# Patient Record
Sex: Female | Born: 1985 | Race: White | Hispanic: No | Marital: Married | Smoking: Never smoker
Health system: Southern US, Community
[De-identification: ages and names within clinical notes are randomized; demographics above are authoritative.]

## PROBLEM LIST (undated history)

## (undated) DIAGNOSIS — G500 Trigeminal neuralgia: Secondary | ICD-10-CM

## (undated) DIAGNOSIS — N200 Calculus of kidney: Secondary | ICD-10-CM

## (undated) DIAGNOSIS — G43909 Migraine, unspecified, not intractable, without status migrainosus: Secondary | ICD-10-CM

## (undated) DIAGNOSIS — D649 Anemia, unspecified: Secondary | ICD-10-CM

## (undated) DIAGNOSIS — F329 Major depressive disorder, single episode, unspecified: Secondary | ICD-10-CM

## (undated) DIAGNOSIS — F32A Depression, unspecified: Secondary | ICD-10-CM

## (undated) DIAGNOSIS — N12 Tubulo-interstitial nephritis, not specified as acute or chronic: Secondary | ICD-10-CM

## (undated) DIAGNOSIS — Z8619 Personal history of other infectious and parasitic diseases: Secondary | ICD-10-CM

## (undated) HISTORY — DX: Major depressive disorder, single episode, unspecified: F32.9

## (undated) HISTORY — DX: Calculus of kidney: N20.0

## (undated) HISTORY — PX: WISDOM TOOTH EXTRACTION: SHX21

## (undated) HISTORY — DX: Depression, unspecified: F32.A

## (undated) HISTORY — DX: Trigeminal neuralgia: G50.0

## (undated) HISTORY — DX: Personal history of other infectious and parasitic diseases: Z86.19

## (undated) HISTORY — DX: Tubulo-interstitial nephritis, not specified as acute or chronic: N12

## (undated) HISTORY — DX: Migraine, unspecified, not intractable, without status migrainosus: G43.909

---

## 2008-01-02 DIAGNOSIS — N12 Tubulo-interstitial nephritis, not specified as acute or chronic: Secondary | ICD-10-CM

## 2008-01-02 HISTORY — DX: Tubulo-interstitial nephritis, not specified as acute or chronic: N12

## 2011-12-27 ENCOUNTER — Encounter (HOSPITAL_COMMUNITY): Payer: Self-pay | Admitting: Emergency Medicine

## 2011-12-27 ENCOUNTER — Emergency Department (HOSPITAL_COMMUNITY)
Admission: EM | Admit: 2011-12-27 | Discharge: 2011-12-27 | Disposition: A | Discharge status: Home / Self Care | Payer: 59 | Attending: Emergency Medicine | Admitting: Emergency Medicine

## 2011-12-27 DIAGNOSIS — G500 Trigeminal neuralgia: Secondary | ICD-10-CM | POA: Insufficient documentation

## 2011-12-27 DIAGNOSIS — K117 Disturbances of salivary secretion: Secondary | ICD-10-CM | POA: Insufficient documentation

## 2011-12-27 DIAGNOSIS — R209 Unspecified disturbances of skin sensation: Secondary | ICD-10-CM | POA: Insufficient documentation

## 2011-12-27 DIAGNOSIS — R1110 Vomiting, unspecified: Secondary | ICD-10-CM | POA: Insufficient documentation

## 2011-12-27 HISTORY — DX: Anemia, unspecified: D64.9

## 2011-12-27 MED ORDER — OXYCODONE-ACETAMINOPHEN 5-325 MG PO TABS
1.0000 | ORAL_TABLET | Freq: Once | ORAL | Status: AC
  Administered 2011-12-27: 1 via ORAL
  Filled 2011-12-27: qty 1

## 2011-12-27 MED ORDER — CARBAMAZEPINE 200 MG PO TABS: 100.0000 mg | ORAL_TABLET | Freq: Two times a day (BID) | ORAL | Status: DC

## 2011-12-27 MED ORDER — OXYCODONE-ACETAMINOPHEN 5-325 MG PO TABS: 1.0000 | ORAL_TABLET | ORAL | Status: DC | PRN

## 2011-12-27 NOTE — ED Notes (Signed)
Patient states that she was dx. With trigeminal neuralgia by the dentist and endodontist. Patient to follow up with neurologist. Today though she started to have increased pain, and drool. The patient also reports that she has numbness to her right side of her right side of the mouth.

## 2011-12-27 NOTE — ED Notes (Signed)
Pt states "she has been having numbness and tingling to R side of face since Sat. Went to PCP who referred her to Neurologist. Loetta Rough get an appointment until 1/15. PCP told her to come into ER for eval. States her bottom lip is numb and she has R side facial pain that radiates to her jaw. Pain comes and goes." Upon assessment, R side of face is symmetrical, c/o numbness, no tongue deviation, able to puff cheeks, eyes are symmetrical when closed. Denies any arm or chest pain. Good grips and strength BUE.

## 2011-12-27 NOTE — ED Provider Notes (Signed)
History     CSN: [id number]  Arrival date & time 12/27/11  1321   First MD Initiated Contact with Patient 12/27/11 1351      Chief Complaint  Patient presents with  . Facial Pain    (Consider location/radiation/quality/duration/timing/severity/associated sxs/prior treatment) The history is provided by the patient and the spouse.   Cc: 26 year old female here complaining of right facial pain numbness. Past medical history of anemia. Patient states that Friday she began to have pain in her right chin. Sunday she had pain and numbness in her chin. Monday she had pain in her lower lip that radiated to her right jaw, cheek, teeth, eye, head and the back of her neck. States that the pain is constant in her right chin 5/10 in she has episodes of pain that radiates 10 out of 10 to her jaw and head. States she was at [facility] to this morning and noticed she was drooling out of the right side of her mouth. Patient went to the dentist and had a normal x-ray. She then went to the orthodontist to refer her to a neurologist for trigeminal neuralgia.     Past Medical History  Diagnosis Date  . Anemia     History reviewed. No pertinent past surgical history.  History reviewed. No pertinent family history.  History  Substance Use Topics  . Smoking status: Never Smoker   . Smokeless tobacco: Not on file  . Alcohol Use: Yes     Comment: occ.    OB History    Grav Para Term Preterm Abortions TAB SAB Ect Mult Living                  Review of Systems  Constitutional: Negative.  Negative for fever and chills.  HENT: Positive for drooling. Negative for ear pain, congestion, facial swelling, trouble swallowing, dental problem and tinnitus.        Drooling R mouth  Eyes: Positive for pain.  Respiratory: Negative.  Negative for shortness of breath.   Cardiovascular: Negative.   Gastrointestinal: Positive for vomiting. Negative for nausea and abdominal pain.  Musculoskeletal: Negative.     Skin: Negative.   Neurological: Positive for numbness. Negative for dizziness, light-headedness and headaches.  Psychiatric/Behavioral: Negative.   All other systems reviewed and are negative.    Allergies  Review of patient's allergies indicates no known allergies.  Home Medications   Current Outpatient Rx  Name  Route  Sig  Dispense  Refill  . DROSPIRENONE-ETHINYL ESTRADIOL 3-0.03 MG PO TABS   Oral   Take 1 tablet by mouth daily.         Marland Kitchen FERROUS SULFATE 325 (65 FE) MG PO TABS   Oral   Take 325 mg by mouth daily with breakfast.         . IBUPROFEN 600 MG PO TABS   Oral   Take 600 mg by mouth every 6 (six) hours as needed. Pain           BP 110/71  Pulse 91  Temp 97.8 F (36.6 C) (Oral)  Resp 16  SpO2 98%  LMP 12/02/2011  Physical Exam  Nursing note and vitals reviewed. Constitutional: She is oriented to person, place, and time. She appears well-developed and well-nourished.  HENT:  Head: Normocephalic and atraumatic.  Eyes: Conjunctivae normal and EOM are normal. Pupils are equal, round, and reactive to light.  Neck: Normal range of motion. Neck supple.  Cardiovascular: Normal rate.   Pulmonary/Chest: Effort normal and  breath sounds normal. No respiratory distress.  Abdominal: Soft. Bowel sounds are normal. She exhibits no distension. There is no tenderness.  Musculoskeletal: Normal range of motion. She exhibits no edema and no tenderness.  Neurological: She is alert and oriented to person, place, and time. She has normal reflexes. No cranial nerve deficit. Coordination normal.  Skin: Skin is warm and dry.  Psychiatric: She has a normal mood and affect.    ED Course  Procedures (including critical care time)  Labs Reviewed - No data to display No results found.   No diagnosis found.    MDM  R facial trigeminal neuralgia.  Treated in ER with percocet with some relief.  Will start on tegretol and keep her follow up appointment with [facility]  Neurology.  Atyplical for stroke symptoms.  No weakness noted on R face or R side of body.  Pain and numbness.          Remi Haggard, NP 12/27/11 [phone]

## 2011-12-29 NOTE — ED Provider Notes (Signed)
Medical screening examination/treatment/procedure(s) were performed by non-physician practitioner and as supervising physician I was immediately available for consultation/collaboration.  Carin Shipp, MD 12/29/11 0739 

## 2012-04-23 ENCOUNTER — Emergency Department (HOSPITAL_COMMUNITY)
Admission: EM | Admit: 2012-04-23 | Discharge: 2012-04-23 | Disposition: A | Discharge status: Home / Self Care | Payer: BC Managed Care – PPO | Source: Home / Self Care | Attending: Family Medicine | Admitting: Family Medicine

## 2012-04-23 ENCOUNTER — Encounter (HOSPITAL_COMMUNITY): Payer: Self-pay

## 2012-04-23 ENCOUNTER — Emergency Department (INDEPENDENT_AMBULATORY_CARE_PROVIDER_SITE_OTHER): Payer: BC Managed Care – PPO

## 2012-04-23 DIAGNOSIS — S93609A Unspecified sprain of unspecified foot, initial encounter: Secondary | ICD-10-CM

## 2012-04-23 DIAGNOSIS — S93602A Unspecified sprain of left foot, initial encounter: Secondary | ICD-10-CM

## 2012-04-23 MED ORDER — HYDROCODONE-ACETAMINOPHEN 5-325 MG PO TABS
1.0000 | ORAL_TABLET | Freq: Once | ORAL | Status: AC
  Administered 2012-04-23: 1 via ORAL

## 2012-04-23 MED ORDER — IBUPROFEN 600 MG PO TABS: 600.0000 mg | ORAL_TABLET | Freq: Three times a day (TID) | ORAL | Status: DC | PRN

## 2012-04-23 MED ORDER — HYDROCODONE-ACETAMINOPHEN 5-325 MG PO TABS
ORAL_TABLET | ORAL | Status: AC
  Filled 2012-04-23: qty 1

## 2012-04-23 MED ORDER — HYDROCODONE-ACETAMINOPHEN 5-325 MG PO TABS: 2.0000 | ORAL_TABLET | Freq: Three times a day (TID) | ORAL | Status: DC | PRN

## 2012-04-23 NOTE — ED Notes (Signed)
States her foot was numb when she got up to walk , and felt her foot and toes snap; left foot slightly swollen

## 2012-04-23 NOTE — ED Provider Notes (Signed)
History     CSN: [id number]  Arrival date & time 04/23/12  1243   First MD Initiated Contact with Patient 04/23/12 1300      Chief Complaint  Patient presents with  . Foot Injury    (Consider location/radiation/quality/duration/timing/severity/associated sxs/prior treatment) HPI Comments: 27 year old female here complaining of left foot pain after she injured it earlier today. Patient stated she abruptly got up from the couch and accidentally walked over her inverted left foot and fell. Has experienced swelling and pain with walking since. She's able to put weight on his left leg and foot but experience discomfort with walking, also foot movement makes the pain worse.   Past Medical History  Diagnosis Date  . Anemia     History reviewed. No pertinent past surgical history.  History reviewed. No pertinent family history.  History  Substance Use Topics  . Smoking status: Never Smoker   . Smokeless tobacco: Not on file  . Alcohol Use: Yes     Comment: occ.    OB History   Grav Para Term Preterm Abortions TAB SAB Ect Mult Living                  Review of Systems  HENT:       No head injury  Respiratory:       No chest injury  Gastrointestinal:       No abdominal injury  Musculoskeletal:       As per history of present illness  Skin: Negative for wound.  Neurological: Negative for dizziness.  All other systems reviewed and are negative.    Allergies  Review of patient's allergies indicates no known allergies.  Home Medications   Current Outpatient Rx  Name  Route  Sig  Dispense  Refill  . carbamazepine (TEGRETOL) 200 MG tablet   Oral   Take 0.5 tablets (100 mg total) by mouth 2 (two) times daily.   60 tablet   0     Start 100mg  bid then increase daily by 200mg  until ...   . drospirenone-ethinyl estradiol (YASMIN,ZARAH,SYEDA) 3-0.03 MG tablet   Oral   Take 1 tablet by mouth daily.         . ferrous sulfate 325 (65 FE) MG tablet   Oral   Take  325 mg by mouth daily with breakfast.         . HYDROcodone-acetaminophen (NORCO/VICODIN) 5-325 MG per tablet   Oral   Take 2 tablets by mouth every 8 (eight) hours as needed for pain.   15 tablet   0   . ibuprofen (ADVIL,MOTRIN) 600 MG tablet   Oral   Take 1 tablet (600 mg total) by mouth every 8 (eight) hours as needed for pain.   30 tablet   0     BP 106/77  Pulse 99  Temp(Src) 98.5 F (36.9 C) (Oral)  Resp 20  SpO2 100%  LMP 04/11/2012  Physical Exam  Nursing note and vitals reviewed. Constitutional: She is oriented to person, place, and time. She appears well-developed and well-nourished. No distress.  Cardiovascular: Normal heart sounds.   Pulmonary/Chest: Breath sounds normal.  Musculoskeletal:  Left ankle exam normal. Left foot: No deformity. There is lateral swelling, mild bruising and tenderness to palpation lateral to mid foot. Limited dorsiflexion and plantaeextension due to pain, no laxity; weight bearing but reported pain with walking. No hematomas, laceration or abrasions. No toes discoloration or cyanosis. Tibial bone with no focal tenderness or swelling. Entire left lower  extremity appears neurovascularly intact.   Neurological: She is alert and oriented to person, place, and time.  Skin: She is not diaphoretic.  No skin abrasions or lacerations    ED Course  Procedures (including critical care time)  Labs Reviewed - No data to display Dg Foot Complete Left  04/23/2012  *RADIOLOGY REPORT*  Clinical Data: Per pt: walking on concrete and left foot was a sleep, kept walking and heard pop. Pain pointed to lateral aspect of  left foot. No prior injury to left foot  LEFT FOOT - COMPLETE 3+ VIEW  Comparison: None.  Findings: Lisfranc joint alignment normal.  Subtle transversely oriented lucency along the proximal tip of the base of the fifth metatarsal may represent nondisplaced avulsion fracture - correlate with point tenderness in this vicinity.  No  additional significant abnormality observed.  IMPRESSION:  1.  Subtle linear lucency at the proximal tip of the base of the fifth metatarsal, favoring a small nondisplaced avulsion fracture or over an unfused ossification center.  Correlate with point tenderness at this site.   Original Report Authenticated By: Gaylyn Rong, M.D.      1. Foot sprain, left, initial encounter       MDM  Patient was placed on a rigid soled shoe. Prescribed ibuprofen and Norco. Supportive care including rehabilitation exercises discussed with patient and provided in writing. Patient was given referral to followup with orthopedic specialist as needed.       Sharin Grave, MD 04/23/12 1700

## 2012-04-23 NOTE — ED Notes (Signed)
Vitals obtained by xray student 

## 2012-06-17 ENCOUNTER — Encounter: Payer: Self-pay | Admitting: Certified Nurse Midwife

## 2012-06-23 ENCOUNTER — Ambulatory Visit: Payer: Self-pay | Admitting: Certified Nurse Midwife

## 2012-07-16 ENCOUNTER — Ambulatory Visit (INDEPENDENT_AMBULATORY_CARE_PROVIDER_SITE_OTHER): Payer: BC Managed Care – PPO | Admitting: Certified Nurse Midwife

## 2012-07-16 ENCOUNTER — Encounter: Payer: Self-pay | Admitting: Certified Nurse Midwife

## 2012-07-16 VITALS — BP 104/62 | HR 64 | Resp 16 | Ht 63.5 in | Wt 146.0 lb

## 2012-07-16 DIAGNOSIS — Z309 Encounter for contraceptive management, unspecified: Secondary | ICD-10-CM

## 2012-07-16 DIAGNOSIS — Z23 Encounter for immunization: Secondary | ICD-10-CM

## 2012-07-16 DIAGNOSIS — Z01419 Encounter for gynecological examination (general) (routine) without abnormal findings: Secondary | ICD-10-CM

## 2012-07-16 DIAGNOSIS — IMO0001 Reserved for inherently not codable concepts without codable children: Secondary | ICD-10-CM

## 2012-07-16 DIAGNOSIS — Z0000 Encounter for general adult medical examination without abnormal findings: Secondary | ICD-10-CM

## 2012-07-16 MED ORDER — ETONOGESTREL-ETHINYL ESTRADIOL 0.12-0.015 MG/24HR VA RING: VAGINAL_RING | VAGINAL | Status: DC

## 2012-07-16 NOTE — Progress Notes (Signed)
27 y.o. G0P0000 Single Caucasian Fe here for annual exam.  Periods normal,no issues.  Nuvaring working well. Getting married soon! No STD screening needed. Desires  Gardasil today. No health issues today.  Patient's last menstrual period was 06/27/2012.          Sexually active: yes  The current method of family planning is NuvaRing vaginal inserts.    Exercising: yes  biking Smoker:  no  Health Maintenance: Pap: 05-25-10 neg MMG:  none Colonoscopy:  none BMD:   none TDaP:  3/12 Labs: Hgb-11.7 Self breast exam: monthly   reports that she has quit smoking. She does not have any smokeless tobacco history on file. She reports that she drinks about 1.0 ounces of alcohol per week. She reports that she does not use illicit drugs.  Past Medical History  Diagnosis Date  . Anemia   . Migraines     no aura  . Pyelonephritis 2010  . Trigeminal neuralgia     Past Surgical History  Procedure Laterality Date  . Wisdom tooth extraction      Current Outpatient Prescriptions  Medication Sig Dispense Refill  . etonogestrel-ethinyl estradiol (NUVARING) 0.12-0.015 MG/24HR vaginal ring Place 1 each vaginally every 28 (twenty-eight) days. Insert vaginally and leave in place for 3 consecutive weeks, then remove for 1 week.      . ferrous sulfate 325 (65 FE) MG tablet Take 325 mg by mouth daily with breakfast.      . ibuprofen (ADVIL,MOTRIN) 600 MG tablet Take 1 tablet (600 mg total) by mouth every 8 (eight) hours as needed for pain.  30 tablet  0   No current facility-administered medications for this visit.    Family History  Problem Relation Age of Onset  . Osteoporosis Mother   . Cancer Father     skin  . Breast cancer Paternal Grandmother     ROS:  Pertinent items are noted in HPI.  Otherwise, a comprehensive ROS was negative.  Exam:   BP 104/62  Pulse 64  Resp 16  Ht 5' 3.5" (1.613 m)  Wt 146 lb (66.225 kg)  BMI 25.45 kg/m2  LMP 06/27/2012 Height: 5' 3.5" (161.3 cm)  Ht  Readings from Last 3 Encounters:  07/16/12 5' 3.5" (1.613 m)    General appearance: alert, cooperative and appears stated age Head: Normocephalic, without obvious abnormality, atraumatic Neck: no adenopathy, supple, symmetrical, trachea midline and thyroid normal to inspection and palpation Lungs: clear to auscultation bilaterally Breasts: normal appearance, no masses or tenderness, No nipple retraction or dimpling, No nipple discharge or bleeding, No axillary or supraclavicular adenopathy Heart: regular rate and rhythm Abdomen: soft, non-tender; no masses,  no organomegaly Extremities: extremities normal, atraumatic, no cyanosis or edema Skin: Skin color, texture, turgor normal. No rashes or lesions Lymph nodes: Cervical, supraclavicular, and axillary nodes normal. No abnormal inguinal nodes palpated Neurologic: Grossly normal   Pelvic: External genitalia:  no lesions              Urethra:  normal appearing urethra with no masses, tenderness or lesions              Bartholin's and Skene's: normal                 Vagina: normal appearing vagina with normal color and discharge, no lesions              Cervix: normal, non tender              Pap  taken: no Bimanual Exam:  Uterus:  normal size, contour, position, consistency, mobility, non-tender and anteverted              Adnexa: normal adnexa and no mass, fullness, tenderness               Rectovaginal: Confirms               Anus:  normal sphincter tone, no lesions  A:  Well Woman with normal exam  Contraception Nuvaring  Gardasil candidate   P:   Reviewed health and wellness pertinent to exam  Rx Nuvaring see order  Requests Gardasil see order  Wished well with upcoming wedding!  Pap smear as per guidelines   pap smear not taken today  counseled on breast self exam, adequate intake of calcium and vitamin D, diet and exercise  return annually or prn, Rv 2 months for #2 Gardasil  An After Visit Summary was printed and given  to the patient.

## 2012-07-16 NOTE — Patient Instructions (Signed)
General topics  Next pap or exam is  due in 1 year Take a Women's multivitamin Take 1200 mg. of calcium daily - prefer dietary If any concerns in interim to call back  Breast Self-Awareness Practicing breast self-awareness may pick up problems early, prevent significant medical complications, and possibly save your life. By practicing breast self-awareness, you can become familiar with how your breasts look and feel and if your breasts are changing. This allows you to notice changes early. It can also offer you some reassurance that your breast health is good. One way to learn what is normal for your breasts and whether your breasts are changing is to do a breast self-exam. If you find a lump or something that was not present in the past, it is best to contact your caregiver right away. Other findings that should be evaluated by your caregiver include nipple discharge, especially if it is bloody; skin changes or reddening; areas where the skin seems to be pulled in (retracted); or new lumps and bumps. Breast pain is seldom associated with cancer (malignancy), but should also be evaluated by a caregiver. BREAST SELF-EXAM The best time to examine your breasts is 5 7 days after your menstrual period is over.  ExitCare Patient Information 2013 [employer].   Exercise to Stay Healthy Exercise helps you become and stay healthy. EXERCISE IDEAS AND TIPS Choose exercises that:  You enjoy.  Fit into your day. You do not need to exercise really hard to be healthy. You can do exercises at a slow or medium level and stay healthy. You can:  Stretch before and after working out.  Try yoga, Pilates, or tai chi.  Lift weights.  Walk fast, swim, jog, run, climb stairs, bicycle, dance, or rollerskate.  Take aerobic classes. Exercises that burn about 150 calories:  Running 1  miles in 15 minutes.  Playing volleyball for 45 to 60 minutes.  Washing and waxing a car for 45 to 60  minutes.  Playing touch football for 45 minutes.  Walking 1  miles in 35 minutes.  Pushing a stroller 1  miles in 30 minutes.  Playing basketball for 30 minutes.  Raking leaves for 30 minutes.  Bicycling 5 miles in 30 minutes.  Walking 2 miles in 30 minutes.  Dancing for 30 minutes.  Shoveling snow for 15 minutes.  Swimming laps for 20 minutes.  Walking up stairs for 15 minutes.  Bicycling 4 miles in 15 minutes.  Gardening for 30 to 45 minutes.  Jumping rope for 15 minutes.  Washing windows or floors for 45 to 60 minutes. Document Released: 01/20/2010 Document Revised: 03/12/2011 Document Reviewed: 01/20/2010 ExitCare Patient Information 2013 [employer].   Other topics ( that may be useful information):    Sexually Transmitted Disease Sexually transmitted disease (STD) refers to any infection that is passed from person to person during sexual activity. This may happen by way of saliva, semen, blood, vaginal mucus, or urine. Common STDs include:  Gonorrhea.  Chlamydia.  Syphilis.  HIV/AIDS.  Genital herpes.  Hepatitis B and C.  Trichomonas.  Human papillomavirus (HPV).  Pubic lice. CAUSES  An STD may be spread by bacteria, virus, or parasite. A person can get an STD by:  Sexual intercourse with an infected person.  Sharing sex toys with an infected person.  Sharing needles with an infected person.  Having intimate contact with the genitals, mouth, or rectal areas of an infected person. SYMPTOMS  Some people may not have any symptoms, but   they can still pass the infection to others. Different STDs have different symptoms. Symptoms include:  Painful or bloody urination.  Pain in the pelvis, abdomen, vagina, anus, throat, or eyes.  Skin rash, itching, irritation, growths, or sores (lesions). These usually occur in the genital or anal area.  Abnormal vaginal discharge.  Penile discharge in men.  Soft, flesh-colored skin growths in the  genital or anal area.  Fever.  Pain or bleeding during sexual intercourse.  Swollen glands in the groin area.  Yellow skin and eyes (jaundice). This is seen with hepatitis. DIAGNOSIS  To make a diagnosis, your caregiver may:  Take a medical history.  Perform a physical exam.  Take a specimen (culture) to be examined.  Examine a sample of discharge under a microscope.  Perform blood test TREATMENT   Chlamydia, gonorrhea, trichomonas, and syphilis can be cured with antibiotic medicine.  Genital herpes, hepatitis, and HIV can be treated, but not cured, with prescribed medicines. The medicines will lessen the symptoms.  Genital warts from HPV can be treated with medicine or by freezing, burning (electrocautery), or surgery. Warts may come back.  HPV is a virus and cannot be cured with medicine or surgery.However, abnormal areas may be followed very closely by your caregiver and may be removed from the cervix, vagina, or vulva through office procedures or surgery. If your diagnosis is confirmed, your recent sexual partners need treatment. This is true even if they are symptom-free or have a negative culture or evaluation. They should not have sex until their caregiver says it is okay. HOME CARE INSTRUCTIONS  All sexual partners should be informed, tested, and treated for all STDs.  Take your antibiotics as directed. Finish them even if you start to feel better.  Only take over-the-counter or prescription medicines for pain, discomfort, or fever as directed by your caregiver.  Rest.  Eat a balanced diet and drink enough fluids to keep your urine clear or pale yellow.  Do not have sex until treatment is completed and you have followed up with your caregiver. STDs should be checked after treatment.  Keep all follow-up appointments, Pap tests, and blood tests as directed by your caregiver.  Only use latex condoms and water-soluble lubricants during sexual activity. Do not use  petroleum jelly or oils.  Avoid alcohol and illegal drugs.  Get vaccinated for HPV and hepatitis. If you have not received these vaccines in the past, talk to your caregiver about whether one or both might be right for you.  Avoid risky sex practices that can break the skin. The only way to avoid getting an STD is to avoid all sexual activity.Latex condoms and dental dams (for oral sex) will help lessen the risk of getting an STD, but will not completely eliminate the risk. SEEK MEDICAL CARE IF:   You have a fever.  You have any new or worsening symptoms. Document Released: 03/10/2002 Document Revised: 03/12/2011 Document Reviewed: 03/17/2010 ExitCare Patient Information 2013 [employer].    Domestic Abuse You are being battered or abused if someone close to you hits, pushes, or physically hurts you in any way. You also are being abused if you are forced into activities. You are being sexually abused if you are forced to have sexual contact of any kind. You are being emotionally abused if you are made to feel worthless or if you are constantly threatened. It is important to remember that help is available. No one has the right to abuse you. PREVENTION OF FURTHER   ABUSE  Learn the warning signs of danger. This varies with situations but may include: the use of alcohol, threats, isolation from friends and family, or forced sexual contact. Leave if you feel that violence is going to occur.  If you are attacked or beaten, report it to the police so the abuse is documented. You do not have to press charges. The police can protect you while you or the attackers are leaving. Get the officer's name and badge number and a copy of the report.  Find someone you can trust and tell them what is happening to you: your caregiver, a nurse, clergy member, close friend or family member. Feeling ashamed is natural, but remember that you have done nothing wrong. No one deserves abuse. Document Released:  12/16/1999 Document Revised: 03/12/2011 Document Reviewed: 02/23/2010 ExitCare Patient Information 2013 [employer].    How Much is Too Much Alcohol? Drinking too much alcohol can cause injury, accidents, and health problems. These types of problems can include:   Car crashes.  Falls.  Family fighting (domestic violence).  Drowning.  Fights.  Injuries.  Burns.  Damage to certain organs.  Having a baby with birth defects. ONE DRINK CAN BE TOO MUCH WHEN YOU ARE:  Working.  Pregnant or breastfeeding.  Taking medicines. Ask your doctor.  Driving or planning to drive. If you or someone you know has a drinking problem, get help from a doctor.  Document Released: 10/14/2008 Document Revised: 03/12/2011 Document Reviewed: 10/14/2008 ExitCare Patient Information 2013 [employer].   Smoking Hazards Smoking cigarettes is extremely bad for your health. Tobacco smoke has over 200 known poisons in it. There are over 60 chemicals in tobacco smoke that cause cancer. Some of the chemicals found in cigarette smoke include:   Cyanide.  Benzene.  Formaldehyde.  Methanol (wood alcohol).  Acetylene (fuel used in welding torches).  Ammonia. Cigarette smoke also contains the poisonous gases nitrogen oxide and carbon monoxide.  Cigarette smokers have an increased risk of many serious medical problems and Smoking causes approximately:  90% of all lung cancer deaths in men.  80% of all lung cancer deaths in women.  90% of deaths from chronic obstructive lung disease. Compared with nonsmokers, smoking increases the risk of:  Coronary heart disease by 2 to 4 times.  Stroke by 2 to 4 times.  Men developing lung cancer by 23 times.  Women developing lung cancer by 13 times.  Dying from chronic obstructive lung diseases by 12 times.  . Smoking is the most preventable cause of death and disease in our society.  WHY IS SMOKING ADDICTIVE?  Nicotine is the chemical  agent in tobacco that is capable of causing addiction or dependence.  When you smoke and inhale, nicotine is absorbed rapidly into the bloodstream through your lungs. Nicotine absorbed through the lungs is capable of creating a powerful addiction. Both inhaled and non-inhaled nicotine may be addictive.  Addiction studies of cigarettes and spit tobacco show that addiction to nicotine occurs mainly during the teen years, when young people begin using tobacco products. WHAT ARE THE BENEFITS OF QUITTING?  There are many health benefits to quitting smoking.   Likelihood of developing cancer and heart disease decreases. Health improvements are seen almost immediately.  Blood pressure, pulse rate, and breathing patterns start returning to normal soon after quitting. QUITTING SMOKING   American Lung Association - 1-800-LUNGUSA  American Cancer Society - 1-800-ACS-2345 Document Released: 01/26/2004 Document Revised: 03/12/2011 Document Reviewed: 09/29/2008 ExitCare Patient Information 2013 [employer].   Stress Management Stress is a state of physical or mental tension that often results from changes in your life or normal routine. Some common causes of stress are:  Death of a loved one.  Injuries or severe illnesses.  Getting fired or changing jobs.  Moving into a new home. Other causes may be:  Sexual problems.  Business or financial losses.  Taking on a large debt.  Regular conflict with someone at home or at work.  Constant tiredness from lack of sleep. It is not just bad things that are stressful. It may be stressful to:  Win the lottery.  Get married.  Buy a new car. The amount of stress that can be easily tolerated varies from person to person. Changes generally cause stress, regardless of the types of change. Too much stress can affect your health. It may lead to physical or emotional problems. Too little stress (boredom) may also become stressful. SUGGESTIONS TO  REDUCE STRESS:  Talk things over with your family and friends. It often is helpful to share your concerns and worries. If you feel your problem is serious, you may want to get help from a professional counselor.  Consider your problems one at a time instead of lumping them all together. Trying to take care of everything at once may seem impossible. List all the things you need to do and then start with the most important one. Set a goal to accomplish 2 or 3 things each day. If you expect to do too many in a single day you will naturally fail, causing you to feel even more stressed.  Do not use alcohol or drugs to relieve stress. Although you may feel better for a short time, they do not remove the problems that caused the stress. They can also be habit forming.  Exercise regularly - at least 3 times per week. Physical exercise can help to relieve that "uptight" feeling and will relax you.  The shortest distance between despair and hope is often a good night's sleep.  Go to bed and get up on time allowing yourself time for appointments without being rushed.  Take a short "time-out" period from any stressful situation that occurs during the day. Close your eyes and take some deep breaths. Starting with the muscles in your face, tense them, hold it for a few seconds, then relax. Repeat this with the muscles in your neck, shoulders, hand, stomach, back and legs.  Take good care of yourself. Eat a balanced diet and get plenty of rest.  Schedule time for having fun. Take a break from your daily routine to relax. HOME CARE INSTRUCTIONS   Call if you feel overwhelmed by your problems and feel you can no longer manage them on your own.  Return immediately if you feel like hurting yourself or someone else. Document Released: 06/13/2000 Document Revised: 03/12/2011 Document Reviewed: 02/03/2007 ExitCare Patient Information 2013 [employer].   

## 2012-09-17 ENCOUNTER — Ambulatory Visit (INDEPENDENT_AMBULATORY_CARE_PROVIDER_SITE_OTHER): Payer: BC Managed Care – PPO | Admitting: *Deleted

## 2012-09-17 VITALS — BP 100/70 | HR 60 | Temp 98.3°F | Ht 63.5 in | Wt 145.0 lb

## 2012-09-17 DIAGNOSIS — Z23 Encounter for immunization: Secondary | ICD-10-CM

## 2012-09-17 NOTE — Patient Instructions (Signed)
Please return after 01/16/13 for 3rd injection.

## 2012-09-17 NOTE — Progress Notes (Signed)
Pt presented for 2nd Gardasil.  Pt tolerated well in left deltoid.  Pt will return after 01/16/13 for next injection.

## 2013-01-21 ENCOUNTER — Ambulatory Visit (INDEPENDENT_AMBULATORY_CARE_PROVIDER_SITE_OTHER): Payer: Managed Care, Other (non HMO) | Admitting: *Deleted

## 2013-01-21 VITALS — BP 90/60 | HR 68 | Ht 63.5 in | Wt 152.0 lb

## 2013-01-21 DIAGNOSIS — Z23 Encounter for immunization: Secondary | ICD-10-CM

## 2013-01-21 NOTE — Progress Notes (Signed)
Patient ID: Nancy Wells, female   DOB: 1985-03-26, 26 y.o.   MRN: [id number] Pt arrived for 3rd Gardasil.  Pt tolerated well in right deltoid.  Series completed.

## 2013-07-22 ENCOUNTER — Other Ambulatory Visit: Payer: Self-pay | Admitting: Certified Nurse Midwife

## 2013-07-23 NOTE — Telephone Encounter (Signed)
[facility] Last filled - 07/16/12, X 1 YEAR Last AEX - 07/16/12 Next AEX - 07/27/13 RX sent to last until AEX.

## 2013-07-27 ENCOUNTER — Ambulatory Visit: Payer: Managed Care, Other (non HMO) | Admitting: Certified Nurse Midwife

## 2013-07-29 ENCOUNTER — Encounter: Payer: Self-pay | Admitting: Certified Nurse Midwife

## 2013-07-29 ENCOUNTER — Ambulatory Visit (INDEPENDENT_AMBULATORY_CARE_PROVIDER_SITE_OTHER): Payer: Managed Care, Other (non HMO) | Admitting: Certified Nurse Midwife

## 2013-07-29 VITALS — BP 100/70 | HR 72 | Resp 16 | Ht 63.0 in | Wt 152.0 lb

## 2013-07-29 DIAGNOSIS — Z124 Encounter for screening for malignant neoplasm of cervix: Secondary | ICD-10-CM

## 2013-07-29 DIAGNOSIS — Z01419 Encounter for gynecological examination (general) (routine) without abnormal findings: Secondary | ICD-10-CM

## 2013-07-29 DIAGNOSIS — Z309 Encounter for contraceptive management, unspecified: Secondary | ICD-10-CM

## 2013-07-29 MED ORDER — ETONOGESTREL-ETHINYL ESTRADIOL 0.12-0.015 MG/24HR VA RING: 1.0000 | VAGINAL_RING | VAGINAL | Status: DC

## 2013-07-29 NOTE — Progress Notes (Signed)
28 y.o. G0P0000 Married Caucasian Fe here for annual exam.  Periods normal, no issues or spotting. Contraception working well. Married now! Happy! Complaining of joint pain has increased calcium in diet which has helped. Just moved into new home, so very busy. Sees PCP prn. No other health issues today.  Patient's last menstrual period was 07/23/2013.          Sexually active: Yes.    The current method of family planning is NuvaRing vaginal inserts.    Exercising: Yes.    walking Smoker:  no  Health Maintenance: Pap: 05-25-10 neg MMG:  none Colonoscopy:  none BMD:   none TDaP:  3/12 Labs: none  Self breast exam: done monthly   reports that she has never smoked. She does not have any smokeless tobacco history on file. She reports that she drinks about 2 ounces of alcohol per week. She reports that she does not use illicit drugs.  Past Medical History  Diagnosis Date  . Anemia   . Migraines     no aura  . Pyelonephritis 2010  . Trigeminal neuralgia     Past Surgical History  Procedure Laterality Date  . Wisdom tooth extraction      Current Outpatient Prescriptions  Medication Sig Dispense Refill  . ferrous sulfate 325 (65 FE) MG tablet Take 325 mg by mouth as needed.       Marland Kitchen. ibuprofen (ADVIL,MOTRIN) 600 MG tablet Take 1 tablet (600 mg total) by mouth every 8 (eight) hours as needed for pain.  30 tablet  0  . NUVARING 0.12-0.015 MG/24HR vaginal ring INSERT VAGINALLY AND LEAVE IN PLACE FOR 3 CONSECUTIVE WEEKS, THEN REMOVE FOR 1 WEEK.  1 each  0   No current facility-administered medications for this visit.    Family History  Problem Relation Age of Onset  . Osteoporosis Mother   . Cancer Father     skin  . Breast cancer Paternal Grandmother     ROS:  Pertinent items are noted in HPI.  Otherwise, a comprehensive ROS was negative.  Exam:   BP 100/70  Pulse 72  Resp 16  Ht 5\' 3"  (1.6 m)  Wt 152 lb (68.947 kg)  BMI 26.93 kg/m2  LMP 07/23/2013 Height: 5\' 3"  (160 cm)   Ht Readings from Last 3 Encounters:  07/29/13 5\' 3"  (1.6 m)  01/21/13 5' 3.5" (1.613 m)  09/17/12 5' 3.5" (1.613 m)    General appearance: alert, cooperative and appears stated age Head: Normocephalic, without obvious abnormality, atraumatic Neck: no adenopathy, supple, symmetrical, trachea midline and thyroid normal to inspection and palpation and non-palpable Lungs: clear to auscultation bilaterally Breasts: normal appearance, no masses or tenderness, No nipple retraction or dimpling, No nipple discharge or bleeding, No axillary or supraclavicular adenopathy Heart: regular rate and rhythm Abdomen: soft, non-tender; no masses,  no organomegaly Extremities: extremities normal, atraumatic, no cyanosis or edema Skin: Skin color, texture, turgor normal. No rashes or lesions Lymph nodes: Cervical, supraclavicular, and axillary nodes normal. No abnormal inguinal nodes palpated Neurologic: Grossly normal   Pelvic: External genitalia:  no lesions              Urethra:  normal appearing urethra with no masses, tenderness or lesions              Bartholin's and Skene's: normal                 Vagina: normal appearing vagina with normal color and discharge, no lesions,Nuvaring present  Cervix: normal, non tender              Pap taken: Yes.   Bimanual Exam:  Uterus:  normal size, contour, position, consistency, mobility, non-tender and anteverted              Adnexa: normal adnexa and no mass, fullness, tenderness               Rectovaginal: Confirms               Anus:  normal appearance  A:  Well Woman with normal exam  Contraception Nuvaring desired    P:   Reviewed health and wellness pertinent to exam  Rx Nuvaring see order  Pap smear taken today with reflex   counseled on breast self exam, adequate intake of calcium and vitamin D, diet and exercise  return annually or prn  An After Visit Summary was printed and given to the patient.

## 2013-07-29 NOTE — Patient Instructions (Addendum)
General topics  Next pap or exam is  due in 1 year Take a Women's multivitamin Take 1200 mg. of calcium daily - prefer dietary If any concerns in interim to call back  Breast Self-Awareness Practicing breast self-awareness may pick up problems early, prevent significant medical complications, and possibly save your life. By practicing breast self-awareness, you can become familiar with how your breasts look and feel and if your breasts are changing. This allows you to notice changes early. It can also offer you some reassurance that your breast health is good. One way to learn what is normal for your breasts and whether your breasts are changing is to do a breast self-exam. If you find a lump or something that was not present in the past, it is best to contact your caregiver right away. Other findings that should be evaluated by your caregiver include nipple discharge, especially if it is bloody; skin changes or reddening; areas where the skin seems to be pulled in (retracted); or new lumps and bumps. Breast pain is seldom associated with cancer (malignancy), but should also be evaluated by a caregiver. BREAST SELF-EXAM The best time to examine your breasts is 5 7 days after your menstrual period is over.  Exit[employer] Patient Information 2013 [employer].   Exercise to Stay Healthy Exercise helps you become and stay healthy. EXERCISE IDEAS AND TIPS Choose exercises that:  You enjoy.  Fit into your day. You do not need to exercise really hard to be healthy. You can do exercises at a slow or medium level and stay healthy. You can:  Stretch before and after working out.  Try yoga, Pilates, or tai chi.  Lift weights.  Walk fast, swim, jog, run, climb stairs, bicycle, dance, or rollerskate.  Take aerobic classes. Exercises that burn about 150 calories:  Running 1  miles in 15 minutes.  Playing volleyball for 45 to 60 minutes.  Washing and waxing a car for 45 to 60  minutes.  Playing touch football for 45 minutes.  Walking 1  miles in 35 minutes.  Pushing a stroller 1  miles in 30 minutes.  Playing basketball for 30 minutes.  Raking leaves for 30 minutes.  Bicycling 5 miles in 30 minutes.  Walking 2 miles in 30 minutes.  Dancing for 30 minutes.  Shoveling snow for 15 minutes.  Swimming laps for 20 minutes.  Walking up stairs for 15 minutes.  Bicycling 4 miles in 15 minutes.  Gardening for 30 to 45 minutes.  Jumping rope for 15 minutes.  Washing windows or floors for 45 to 60 minutes. Document Released: 01/20/2010 Document Revised: 03/12/2011 Document Reviewed: 01/20/2010 [employer] Patient Information 2013 [employer].   Other topics ( that may be useful information):    Sexually Transmitted Disease Sexually transmitted disease (STD) refers to any infection that is passed from person to person during sexual activity. This may happen by way of saliva, semen, blood, vaginal mucus, or urine. Common STDs include:  Gonorrhea.  Chlamydia.  Syphilis.  HIV/AIDS.  Genital herpes.  Hepatitis B and C.  Trichomonas.  Human papillomavirus (HPV).  Pubic lice. CAUSES  An STD may be spread by bacteria, virus, or parasite. A person can get an STD by:  Sexual intercourse with an infected person.  Sharing sex toys with an infected person.  Sharing needles with an infected person.  Having intimate contact with the genitals, mouth, or rectal areas of an infected person. SYMPTOMS  Some people may not have any symptoms, but   they can still pass the infection to others. Different STDs have different symptoms. Symptoms include:  Painful or bloody urination.  Pain in the pelvis, abdomen, vagina, anus, throat, or eyes.  Skin rash, itching, irritation, growths, or sores (lesions). These usually occur in the genital or anal area.  Abnormal vaginal discharge.  Penile discharge in men.  Soft, flesh-colored skin growths in the  genital or anal area.  Fever.  Pain or bleeding during sexual intercourse.  Swollen glands in the groin area.  Yellow skin and eyes (jaundice). This is seen with hepatitis. DIAGNOSIS  To make a diagnosis, your caregiver may:  Take a medical history.  Perform a physical exam.  Take a specimen (culture) to be examined.  Examine a sample of discharge under a microscope.  Perform blood test TREATMENT   Chlamydia, gonorrhea, trichomonas, and syphilis can be cured with antibiotic medicine.  Genital herpes, hepatitis, and HIV can be treated, but not cured, with prescribed medicines. The medicines will lessen the symptoms.  Genital warts from HPV can be treated with medicine or by freezing, burning (electrocautery), or surgery. Warts may come back.  HPV is a virus and cannot be cured with medicine or surgery.However, abnormal areas may be followed very closely by your caregiver and may be removed from the cervix, vagina, or vulva through office procedures or surgery. If your diagnosis is confirmed, your recent sexual partners need treatment. This is true even if they are symptom-free or have a negative culture or evaluation. They should not have sex until their caregiver says it is okay. HOME CARE INSTRUCTIONS  All sexual partners should be informed, tested, and treated for all STDs.  Take your antibiotics as directed. Finish them even if you start to feel better.  Only take over-the-counter or prescription medicines for pain, discomfort, or fever as directed by your caregiver.  Rest.  Eat a balanced diet and drink enough fluids to keep your urine clear or pale yellow.  Do not have sex until treatment is completed and you have followed up with your caregiver. STDs should be checked after treatment.  Keep all follow-up appointments, Pap tests, and blood tests as directed by your caregiver.  Only use latex condoms and water-soluble lubricants during sexual activity. Do not use  petroleum jelly or oils.  Avoid alcohol and illegal drugs.  Get vaccinated for HPV and hepatitis. If you have not received these vaccines in the past, talk to your caregiver about whether one or both might be right for you.  Avoid risky sex practices that can break the skin. The only way to avoid getting an STD is to avoid all sexual activity.Latex condoms and dental dams (for oral sex) will help lessen the risk of getting an STD, but will not completely eliminate the risk. SEEK MEDICAL CARE IF:   You have a fever.  You have any new or worsening symptoms. Document Released: 03/10/2002 Document Revised: 03/12/2011 Document Reviewed: 03/17/2010 [facility] Patient Information 2013 [place].    Domestic Abuse You are being battered or abused if someone close to you hits, pushes, or physically hurts you in any way. You also are being abused if you are forced into activities. You are being sexually abused if you are forced to have sexual contact of any kind. You are being emotionally abused if you are made to feel worthless or if you are constantly threatened. It is important to remember that help is available. No one has the right to abuse you. PREVENTION OF FURTHER  ABUSE  Learn the warning signs of danger. This varies with situations but may include: the use of alcohol, threats, isolation from friends and family, or forced sexual contact. Leave if you feel that violence is going to occur.  If you are attacked or beaten, report it to the police so the abuse is documented. You do not have to press charges. The police can protect you while you or the attackers are leaving. Get the officer's name and badge number and a copy of the report.  Find someone you can trust and tell them what is happening to you: your caregiver, a nurse, clergy member, close friend or family member. Feeling ashamed is natural, but remember that you have done nothing wrong. No one deserves abuse. Document Released:  12/16/1999 Document Revised: 03/12/2011 Document Reviewed: 02/23/2010 [employer] Patient Information 2013 [employer].    How Much is Too Much Alcohol? Drinking too much alcohol can cause injury, accidents, and health problems. These types of problems can include:   Car crashes.  Falls.  Family fighting (domestic violence).  Drowning.  Fights.  Injuries.  Burns.  Damage to certain organs.  Having a baby with birth defects. ONE DRINK CAN BE TOO MUCH WHEN YOU ARE:  Working.  Pregnant or breastfeeding.  Taking medicines. Ask your doctor.  Driving or planning to drive. If you or someone you know has a drinking problem, get help from a doctor.  Document Released: 10/14/2008 Document Revised: 03/12/2011 Document Reviewed: 10/14/2008 [employer] Patient Information 2013 [employer].   Smoking Hazards Smoking cigarettes is extremely bad for your health. Tobacco smoke has over 200 known poisons in it. There are over 60 chemicals in tobacco smoke that cause cancer. Some of the chemicals found in cigarette smoke include:   Cyanide.  Benzene.  Formaldehyde.  Methanol (wood alcohol).  Acetylene (fuel used in welding torches).  Ammonia. Cigarette smoke also contains the poisonous gases nitrogen oxide and carbon monoxide.  Cigarette smokers have an increased risk of many serious medical problems and Smoking causes approximately:  90% of all lung cancer deaths in men.  80% of all lung cancer deaths in women.  90% of deaths from chronic obstructive lung disease. Compared with nonsmokers, smoking increases the risk of:  Coronary heart disease by 2 to 4 times.  Stroke by 2 to 4 times.  Men developing lung cancer by 23 times.  Women developing lung cancer by 13 times.  Dying from chronic obstructive lung diseases by 12 times.  . Smoking is the most preventable cause of death and disease in our society.  WHY IS SMOKING ADDICTIVE?  Nicotine is the chemical  agent in tobacco that is capable of causing addiction or dependence.  When you smoke and inhale, nicotine is absorbed rapidly into the bloodstream through your lungs. Nicotine absorbed through the lungs is capable of creating a powerful addiction. Both inhaled and non-inhaled nicotine may be addictive.  Addiction studies of cigarettes and spit tobacco show that addiction to nicotine occurs mainly during the teen years, when young people begin using tobacco products. WHAT ARE THE BENEFITS OF QUITTING?  There are many health benefits to quitting smoking.   Likelihood of developing cancer and heart disease decreases. Health improvements are seen almost immediately.  Blood pressure, pulse rate, and breathing patterns start returning to normal soon after quitting. QUITTING SMOKING   American Lung Association - 1-800-LUNGUSA  American Cancer Society - 1-800-ACS-2345 Document Released: 01/26/2004 Document Revised: 03/12/2011 Document Reviewed: 09/29/2008 Exit[employer] Patient Information 2013 [employer].   Stress Management Stress is a state of physical or mental tension that often results from changes in your life or normal routine. Some common causes of stress are:  Death of a loved one.  Injuries or severe illnesses.  Getting fired or changing jobs.  Moving into a new home. Other causes may be:  Sexual problems.  Business or financial losses.  Taking on a large debt.  Regular conflict with someone at home or at work.  Constant tiredness from lack of sleep. It is not just bad things that are stressful. It may be stressful to:  Win the lottery.  Get married.  Buy a new car. The amount of stress that can be easily tolerated varies from person to person. Changes generally cause stress, regardless of the types of change. Too much stress can affect your health. It may lead to physical or emotional problems. Too little stress (boredom) may also become stressful. SUGGESTIONS TO  REDUCE STRESS:  Talk things over with your family and friends. It often is helpful to share your concerns and worries. If you feel your problem is serious, you may want to get help from a professional counselor.  Consider your problems one at a time instead of lumping them all together. Trying to take care of everything at once may seem impossible. List all the things you need to do and then start with the most important one. Set a goal to accomplish 2 or 3 things each day. If you expect to do too many in a single day you will naturally fail, causing you to feel even more stressed.  Do not use alcohol or drugs to relieve stress. Although you may feel better for a short time, they do not remove the problems that caused the stress. They can also be habit forming.  Exercise regularly - at least 3 times per week. Physical exercise can help to relieve that "uptight" feeling and will relax you.  The shortest distance between despair and hope is often a good night's sleep.  Go to bed and get up on time allowing yourself time for appointments without being rushed.  Take a short "time-out" period from any stressful situation that occurs during the day. Close your eyes and take some deep breaths. Starting with the muscles in your face, tense them, hold it for a few seconds, then relax. Repeat this with the muscles in your neck, shoulders, hand, stomach, back and legs.  Take good care of yourself. Eat a balanced diet and get plenty of rest.  Schedule time for having fun. Take a break from your daily routine to relax. HOME CARE INSTRUCTIONS   Call if you feel overwhelmed by your problems and feel you can no longer manage them on your own.  Return immediately if you feel like hurting yourself or someone else. Document Released: 06/13/2000 Document Revised: 03/12/2011 Document Reviewed: 02/03/2007 ExitCare Patient Information 2013 [employer].       General topics  Next pap or exam is  due in 1  year Take a Women's multivitamin Take 1200 mg. of calcium daily - prefer dietary If any concerns in interim to call back  Breast Self-Awareness Practicing breast self-awareness may pick up problems early, prevent significant medical complications, and possibly save your life. By practicing breast self-awareness, you can become familiar with how your breasts look and feel and if your breasts are changing. This allows you to notice changes early. It can also offer you some reassurance that your breast   health is good. One way to learn what is normal for your breasts and whether your breasts are changing is to do a breast self-exam. If you find a lump or something that was not present in the past, it is best to contact your caregiver right away. Other findings that should be evaluated by your caregiver include nipple discharge, especially if it is bloody; skin changes or reddening; areas where the skin seems to be pulled in (retracted); or new lumps and bumps. Breast pain is seldom associated with cancer (malignancy), but should also be evaluated by a caregiver. BREAST SELF-EXAM The best time to examine your breasts is 5 7 days after your menstrual period is over.  ExitCare Patient Information 2013 [employer].   Exercise to Stay Healthy Exercise helps you become and stay healthy. EXERCISE IDEAS AND TIPS Choose exercises that:  You enjoy.  Fit into your day. You do not need to exercise really hard to be healthy. You can do exercises at a slow or medium level and stay healthy. You can:  Stretch before and after working out.  Try yoga, Pilates, or tai chi.  Lift weights.  Walk fast, swim, jog, run, climb stairs, bicycle, dance, or rollerskate.  Take aerobic classes. Exercises that burn about 150 calories:  Running 1  miles in 15 minutes.  Playing volleyball for 45 to 60 minutes.  Washing and waxing a car for 45 to 60 minutes.  Playing touch football for 45 minutes.  Walking 1   miles in 35 minutes.  Pushing a stroller 1  miles in 30 minutes.  Playing basketball for 30 minutes.  Raking leaves for 30 minutes.  Bicycling 5 miles in 30 minutes.  Walking 2 miles in 30 minutes.  Dancing for 30 minutes.  Shoveling snow for 15 minutes.  Swimming laps for 20 minutes.  Walking up stairs for 15 minutes.  Bicycling 4 miles in 15 minutes.  Gardening for 30 to 45 minutes.  Jumping rope for 15 minutes.  Washing windows or floors for 45 to 60 minutes. Document Released: 01/20/2010 Document Revised: 03/12/2011 Document Reviewed: 01/20/2010 Exit[employer] Patient Information 2013 [employer].   Other topics ( that may be useful information):    Sexually Transmitted Disease Sexually transmitted disease (STD) refers to any infection that is passed from person to person during sexual activity. This may happen by way of saliva, semen, blood, vaginal mucus, or urine. Common STDs include:  Gonorrhea.  Chlamydia.  Syphilis.  HIV/AIDS.  Genital herpes.  Hepatitis B and C.  Trichomonas.  Human papillomavirus (HPV).  Pubic lice. CAUSES  An STD may be spread by bacteria, virus, or parasite. A person can get an STD by:  Sexual intercourse with an infected person.  Sharing sex toys with an infected person.  Sharing needles with an infected person.  Having intimate contact with the genitals, mouth, or rectal areas of an infected person. SYMPTOMS  Some people may not have any symptoms, but they can still pass the infection to others. Different STDs have different symptoms. Symptoms include:  Painful or bloody urination.  Pain in the pelvis, abdomen, vagina, anus, throat, or eyes.  Skin rash, itching, irritation, growths, or sores (lesions). These usually occur in the genital or anal area.  Abnormal vaginal discharge.  Penile discharge in men.  Soft, flesh-colored skin growths in the genital or anal area.  Fever.  Pain or bleeding during  sexual intercourse.  Swollen glands in the groin area.  Yellow skin and eyes (jaundice).   This is seen with hepatitis. DIAGNOSIS  To make a diagnosis, your caregiver may:  Take a medical history.  Perform a physical exam.  Take a specimen (culture) to be examined.  Examine a sample of discharge under a microscope.  Perform blood test TREATMENT   Chlamydia, gonorrhea, trichomonas, and syphilis can be cured with antibiotic medicine.  Genital herpes, hepatitis, and HIV can be treated, but not cured, with prescribed medicines. The medicines will lessen the symptoms.  Genital warts from HPV can be treated with medicine or by freezing, burning (electrocautery), or surgery. Warts may come back.  HPV is a virus and cannot be cured with medicine or surgery.However, abnormal areas may be followed very closely by your caregiver and may be removed from the cervix, vagina, or vulva through office procedures or surgery. If your diagnosis is confirmed, your recent sexual partners need treatment. This is true even if they are symptom-free or have a negative culture or evaluation. They should not have sex until their caregiver says it is okay. HOME CARE INSTRUCTIONS  All sexual partners should be informed, tested, and treated for all STDs.  Take your antibiotics as directed. Finish them even if you start to feel better.  Only take over-the-counter or prescription medicines for pain, discomfort, or fever as directed by your caregiver.  Rest.  Eat a balanced diet and drink enough fluids to keep your urine clear or pale yellow.  Do not have sex until treatment is completed and you have followed up with your caregiver. STDs should be checked after treatment.  Keep all follow-up appointments, Pap tests, and blood tests as directed by your caregiver.  Only use latex condoms and water-soluble lubricants during sexual activity. Do not use petroleum jelly or oils.  Avoid alcohol and illegal  drugs.  Get vaccinated for HPV and hepatitis. If you have not received these vaccines in the past, talk to your caregiver about whether one or both might be right for you.  Avoid risky sex practices that can break the skin. The only way to avoid getting an STD is to avoid all sexual activity.Latex condoms and dental dams (for oral sex) will help lessen the risk of getting an STD, but will not completely eliminate the risk. SEEK MEDICAL CARE IF:   You have a fever.  You have any new or worsening symptoms. Document Released: 03/10/2002 Document Revised: 03/12/2011 Document Reviewed: 03/17/2010 [facility] Patient Information 2013 [place].    Domestic Abuse You are being battered or abused if someone close to you hits, pushes, or physically hurts you in any way. You also are being abused if you are forced into activities. You are being sexually abused if you are forced to have sexual contact of any kind. You are being emotionally abused if you are made to feel worthless or if you are constantly threatened. It is important to remember that help is available. No one has the right to abuse you. PREVENTION OF FURTHER ABUSE  Learn the warning signs of danger. This varies with situations but may include: the use of alcohol, threats, isolation from friends and family, or forced sexual contact. Leave if you feel that violence is going to occur.  If you are attacked or beaten, report it to the police so the abuse is documented. You do not have to press charges. The police can protect you while you or the attackers are leaving. Get the officer's name and badge number and a copy of the report.  Find someone you  can trust and tell them what is happening to you: your caregiver, a nurse, clergy member, close friend or family member. Feeling ashamed is natural, but remember that you have done nothing wrong. No one deserves abuse. Document Released: 12/16/1999 Document Revised: 03/12/2011 Document  Reviewed: 02/23/2010 [employer] Patient Information 2013 [employer].    How Much is Too Much Alcohol? Drinking too much alcohol can cause injury, accidents, and health problems. These types of problems can include:   Car crashes.  Falls.  Family fighting (domestic violence).  Drowning.  Fights.  Injuries.  Burns.  Damage to certain organs.  Having a baby with birth defects. ONE DRINK CAN BE TOO MUCH WHEN YOU ARE:  Working.  Pregnant or breastfeeding.  Taking medicines. Ask your doctor.  Driving or planning to drive. If you or someone you know has a drinking problem, get help from a doctor.  Document Released: 10/14/2008 Document Revised: 03/12/2011 Document Reviewed: 10/14/2008 [employer] Patient Information 2013 [employer].   Smoking Hazards Smoking cigarettes is extremely bad for your health. Tobacco smoke has over 200 known poisons in it. There are over 60 chemicals in tobacco smoke that cause cancer. Some of the chemicals found in cigarette smoke include:   Cyanide.  Benzene.  Formaldehyde.  Methanol (wood alcohol).  Acetylene (fuel used in welding torches).  Ammonia. Cigarette smoke also contains the poisonous gases nitrogen oxide and carbon monoxide.  Cigarette smokers have an increased risk of many serious medical problems and Smoking causes approximately:  90% of all lung cancer deaths in men.  80% of all lung cancer deaths in women.  90% of deaths from chronic obstructive lung disease. Compared with nonsmokers, smoking increases the risk of:  Coronary heart disease by 2 to 4 times.  Stroke by 2 to 4 times.  Men developing lung cancer by 23 times.  Women developing lung cancer by 13 times.  Dying from chronic obstructive lung diseases by 12 times.  . Smoking is the most preventable cause of death and disease in our society.  WHY IS SMOKING ADDICTIVE?  Nicotine is the chemical agent in tobacco that is capable of causing addiction  or dependence.  When you smoke and inhale, nicotine is absorbed rapidly into the bloodstream through your lungs. Nicotine absorbed through the lungs is capable of creating a powerful addiction. Both inhaled and non-inhaled nicotine may be addictive.  Addiction studies of cigarettes and spit tobacco show that addiction to nicotine occurs mainly during the teen years, when young people begin using tobacco products. WHAT ARE THE BENEFITS OF QUITTING?  There are many health benefits to quitting smoking.   Likelihood of developing cancer and heart disease decreases. Health improvements are seen almost immediately.  Blood pressure, pulse rate, and breathing patterns start returning to normal soon after quitting. QUITTING SMOKING   American Lung Association - 1-800-LUNGUSA  American Cancer Society - 1-800-ACS-2345 Document Released: 01/26/2004 Document Revised: 03/12/2011 Document Reviewed: 09/29/2008 [employer] Patient Information 2013 [employer].   Stress Management Stress is a state of physical or mental tension that often results from changes in your life or normal routine. Some common causes of stress are:  Death of a loved one.  Injuries or severe illnesses.  Getting fired or changing jobs.  Moving into a new home. Other causes may be:  Sexual problems.  Business or financial losses.  Taking on a large debt.  Regular conflict with someone at home or at work.  Constant tiredness from lack of sleep. It is not   just bad things that are stressful. It may be stressful to:  Win the lottery.  Get married.  Buy a new car. The amount of stress that can be easily tolerated varies from person to person. Changes generally cause stress, regardless of the types of change. Too much stress can affect your health. It may lead to physical or emotional problems. Too little stress (boredom) may also become stressful. SUGGESTIONS TO REDUCE STRESS:  Talk things over with your family and  friends. It often is helpful to share your concerns and worries. If you feel your problem is serious, you may want to get help from a professional counselor.  Consider your problems one at a time instead of lumping them all together. Trying to take care of everything at once may seem impossible. List all the things you need to do and then start with the most important one. Set a goal to accomplish 2 or 3 things each day. If you expect to do too many in a single day you will naturally fail, causing you to feel even more stressed.  Do not use alcohol or drugs to relieve stress. Although you may feel better for a short time, they do not remove the problems that caused the stress. They can also be habit forming.  Exercise regularly - at least 3 times per week. Physical exercise can help to relieve that "uptight" feeling and will relax you.  The shortest distance between despair and hope is often a good night's sleep.  Go to bed and get up on time allowing yourself time for appointments without being rushed.  Take a short "time-out" period from any stressful situation that occurs during the day. Close your eyes and take some deep breaths. Starting with the muscles in your face, tense them, hold it for a few seconds, then relax. Repeat this with the muscles in your neck, shoulders, hand, stomach, back and legs.  Take good care of yourself. Eat a balanced diet and get plenty of rest.  Schedule time for having fun. Take a break from your daily routine to relax. HOME CARE INSTRUCTIONS   Call if you feel overwhelmed by your problems and feel you can no longer manage them on your own.  Return immediately if you feel like hurting yourself or someone else. Document Released: 06/13/2000 Document Revised: 03/12/2011 Document Reviewed: 02/03/2007 Exit[employer] Patient Information 2013 [employer].   

## 2013-07-30 LAB — IPS PAP TEST WITH REFLEX TO HPV

## 2013-07-30 NOTE — Progress Notes (Signed)
Reviewed personally.  M. Suzanne Shizuye Rupert, MD.  

## 2013-08-12 ENCOUNTER — Encounter: Payer: Self-pay | Admitting: Certified Nurse Midwife

## 2013-08-24 ENCOUNTER — Telehealth: Payer: Self-pay | Admitting: Certified Nurse Midwife

## 2013-08-24 DIAGNOSIS — Z0189 Encounter for other specified special examinations: Secondary | ICD-10-CM

## 2013-08-24 NOTE — Telephone Encounter (Signed)
Rubella immune status, ABO RH, Lymes disease antibody and toxoplasmosis antibody.  Era with lab here said she would call to make sure we were ordering the correct ones for what we wanted. So we need to discuss with her before drawn.

## 2013-08-24 NOTE — Telephone Encounter (Signed)
Spoke with patient. Patient spoke with .Verner Chol CNM through [facility] and would like to schedule preconception labs. Requesting Wednesday morning appointment. Appointment scheduled for Wednesday 8/26 at 8:30am. Patient agreeable to date and time.  Verner Chol CNM what labs would you like this patient to have drawn? In mychart message patient mentioned the titer for the german measles, lymes disease, and toxoplasmosis.

## 2013-08-24 NOTE — Telephone Encounter (Signed)
Patient is ready to schedule preconception labs. (see MyChart msg).

## 2013-08-24 NOTE — Telephone Encounter (Signed)
All labs ordered and verified with Tolitha in the lab.   Routing to provider for final review. Patient agreeable to disposition. Will close encounter

## 2013-08-26 ENCOUNTER — Other Ambulatory Visit (INDEPENDENT_AMBULATORY_CARE_PROVIDER_SITE_OTHER): Payer: Managed Care, Other (non HMO)

## 2013-08-26 DIAGNOSIS — Z0000 Encounter for general adult medical examination without abnormal findings: Secondary | ICD-10-CM

## 2013-08-26 DIAGNOSIS — Z0189 Encounter for other specified special examinations: Secondary | ICD-10-CM

## 2013-08-27 LAB — ABO AND RH: RH TYPE: POSITIVE

## 2013-08-27 LAB — B. BURGDORFI ANTIBODIES: B burgdorferi Ab IgG+IgM: 0.87 {ISR}

## 2013-08-29 LAB — RUBELLA ANTIBODY, IGM: Rubella IgM: 0.31 (ref <0.91)

## 2013-08-29 LAB — TOXOPLASMA ANTIBODIES- IGG AND  IGM: Toxoplasma Antibody- IgM: <3 IU/mL (ref <8.0)

## 2013-09-02 ENCOUNTER — Telehealth: Payer: Self-pay | Admitting: Certified Nurse Midwife

## 2013-09-02 ENCOUNTER — Other Ambulatory Visit: Payer: Self-pay | Admitting: Certified Nurse Midwife

## 2013-09-02 DIAGNOSIS — Z0189 Encounter for other specified special examinations: Secondary | ICD-10-CM

## 2013-09-02 NOTE — Telephone Encounter (Signed)
Routing to Verner Chol CNM for review and advise of labs from 8/26.

## 2013-09-02 NOTE — Telephone Encounter (Signed)
Pt calling to see if her lab results are in yet?

## 2013-09-03 NOTE — Telephone Encounter (Signed)
Patient called this am

## 2013-09-10 ENCOUNTER — Telehealth: Payer: Self-pay

## 2013-09-10 NOTE — Telephone Encounter (Signed)
lmtcb

## 2013-09-10 NOTE — Telephone Encounter (Signed)
Message copied by Eliezer Bottom on Thu Sep 10, 2013  4:44 PM ------      Message from: Verner Chol      Created: Wed Sep 02, 2013  5:24 PM       Notify patient that her blood type is O positive      Screen for lyme disease and Toxoplasmosis was negative to exposure per antibodies      The incorrect test was run for [place] patient needs to come in to have blood redrawn. Order in ------

## 2013-09-14 NOTE — Progress Notes (Signed)
yes

## 2013-09-15 NOTE — Telephone Encounter (Signed)
Per DL, okay to close encounter. See lab

## 2013-09-21 ENCOUNTER — Ambulatory Visit: Payer: BC Managed Care – PPO

## 2013-10-30 ENCOUNTER — Telehealth: Payer: Self-pay | Admitting: Certified Nurse Midwife

## 2013-10-30 ENCOUNTER — Ambulatory Visit (INDEPENDENT_AMBULATORY_CARE_PROVIDER_SITE_OTHER): Payer: Managed Care, Other (non HMO) | Admitting: Obstetrics and Gynecology

## 2013-10-30 ENCOUNTER — Other Ambulatory Visit: Payer: Self-pay | Admitting: Obstetrics and Gynecology

## 2013-10-30 ENCOUNTER — Encounter: Payer: Self-pay | Admitting: Obstetrics and Gynecology

## 2013-10-30 VITALS — BP 130/84 | HR 100 | Ht 63.0 in | Wt 158.2 lb

## 2013-10-30 DIAGNOSIS — N939 Abnormal uterine and vaginal bleeding, unspecified: Secondary | ICD-10-CM

## 2013-10-30 DIAGNOSIS — N9489 Other specified conditions associated with female genital organs and menstrual cycle: Secondary | ICD-10-CM

## 2013-10-30 DIAGNOSIS — R5383 Other fatigue: Secondary | ICD-10-CM

## 2013-10-30 DIAGNOSIS — N949 Unspecified condition associated with female genital organs and menstrual cycle: Secondary | ICD-10-CM

## 2013-10-30 LAB — CBC
HEMATOCRIT: 36.1 % (ref 36.0–46.0)
HEMOGLOBIN: 12.2 g/dL (ref 12.0–15.0)
MCH: 25.3 pg — ABNORMAL LOW (ref 26.0–34.0)
MCHC: 33.8 g/dL (ref 30.0–36.0)
MCV: 74.7 fL — AB (ref 78.0–100.0)
Platelets: 437 10*3/uL — ABNORMAL HIGH (ref 150–400)
RBC: 4.83 MIL/uL (ref 3.87–5.11)
RDW: 14.2 % (ref 11.5–15.5)
WBC: 8.4 10*3/uL (ref 4.0–10.5)

## 2013-10-30 LAB — COMPREHENSIVE METABOLIC PANEL
ALBUMIN: 4.2 g/dL (ref 3.5–5.2)
ALT: 10 U/L (ref 0–35)
AST: 12 U/L (ref 0–37)
Alkaline Phosphatase: 64 U/L (ref 39–117)
BUN: 11 mg/dL (ref 6–23)
CALCIUM: 9.6 mg/dL (ref 8.4–10.5)
CHLORIDE: 103 meq/L (ref 96–112)
CO2: 27 meq/L (ref 19–32)
CREATININE: 0.63 mg/dL (ref 0.50–1.10)
GLUCOSE: 148 mg/dL — AB (ref 70–99)
POTASSIUM: 4.1 meq/L (ref 3.5–5.3)
Sodium: 140 mEq/L (ref 135–145)
Total Bilirubin: 0.3 mg/dL (ref 0.2–1.2)
Total Protein: 7.1 g/dL (ref 6.0–8.3)

## 2013-10-30 LAB — HEMOGLOBIN, FINGERSTICK: HEMOGLOBIN, FINGERSTICK: 12.1 g/dL (ref 12.0–16.0)

## 2013-10-30 LAB — TSH: TSH: 0.809 u[IU]/mL (ref 0.350–4.500)

## 2013-10-30 LAB — POCT URINE PREGNANCY: Preg Test, Ur: NEGATIVE

## 2013-10-30 NOTE — Telephone Encounter (Signed)
Pt thinks she may be having a miscarriage.

## 2013-10-30 NOTE — Telephone Encounter (Addendum)
Spoke with patient at time of incoming call.  Patient states LMP was approximately 09/30/13, patient unsure of last date. Currently on Nuvaring and has not been late with insertion or removal or lost ring. Patient states last Friday 10/23/13, she became nauseated and has remained nauseated. She removed nuvaring 2 days later, Sunday, and began cycle. Today patient reports much heavier cycles than normal with severe cramping. Changing pad q two hours with dime sized clots. Rates cramping pain as 7/10 today and has not taken any medications today for pain. Patient reports uterine cramping with pain that radiates to upper back. Denies urinary complaints.   Spoke with Dr. Edward JollySilva, advise patient to come now for appointment. Patient coming now for appointment.  Routing to provider for final review. Patient agreeable to disposition. Will close encounter

## 2013-10-30 NOTE — Progress Notes (Signed)
Patient ID: Nancy Wells, female   DOB: May 24, 1985, 28 y.o.   MRN: [id number] GYNECOLOGY VISIT  PCP:  None  Referring provider:   HPI: 28 y.o.   Married  Caucasian  female   [id number] with Patient's last menstrual period was 10/25/2013.   here for abnormal uterine bleeding with cramps and passing dime size clots.  This am started heavy menses and clotting, better now.  Cramping and shooting pains which is new.  Pain is also improved.  Has nausea and is fatigued.  Took NuvaRing out 5 days ago.  Ring fell out for a couple of hours on about 2 weeks ago.   Not feeling well for one week. No exposure to ill persons. Denies fever, vomiting, diarrhea, dysuria, cough or URI symptoms.   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   Hgb:    12.1 Urine UPT: Neg  GYNECOLOGIC HISTORY: Patient's last menstrual period was 10/25/2013. Sexually active:  yes Partner preference: female Contraception:  Nuvaring Menopausal hormone therapy: n/a DES exposure:   no Blood transfusions:  no  Sexually transmitted diseases:  no  GYN procedures and prior surgeries:  no Last mammogram:   n/a              Last pap and high risk HPV testing:  07-29-13 wnl  History of abnormal pap smear:  no   OB History   Grav Para Term Preterm Abortions TAB SAB Ect Mult Living   0 0 0 0 0 0 0 0 0 0        LIFESTYLE: Exercise:               Tobacco: no Alcohol:    rarely Drug use:  no  There are no active problems to display for this patient.   Past Medical History  Diagnosis Date  . Anemia   . Migraines     no aura  . Pyelonephritis 2010  . Trigeminal neuralgia     Past Surgical History  Procedure Laterality Date  . Wisdom tooth extraction      Current Outpatient Prescriptions  Medication Sig Dispense Refill  . etonogestrel-ethinyl estradiol (NUVARING) 0.12-0.015 MG/24HR vaginal ring Place 1 each vaginally every 28 (twenty-eight) days. Insert vaginally and leave in place for 3  consecutive weeks, then remove for 1 week.  1 each  12  . ferrous sulfate 325 (65 FE) MG tablet Take 325 mg by mouth as needed.       Marland Kitchen. ibuprofen (ADVIL,MOTRIN) 600 MG tablet Take 1 tablet (600 mg total) by mouth every 8 (eight) hours as needed for pain.  30 tablet  0   No current facility-administered medications for this visit.     ALLERGIES: Tegretol  Family History  Problem Relation Age of Onset  . Osteoporosis Mother   . Cancer Father     skin  . Breast cancer Paternal Grandmother     History   Social History  . Marital Status: Married    Spouse Name: N/A    Number of Children: N/A  . Years of Education: N/A   Occupational History  . Not on file.   Social History Main Topics  . Smoking status: Never Smoker   . Smokeless tobacco: Not on file  . Alcohol Use: 2.0 oz/week    4 drink(s) per week  . Drug Use: No  . Sexual Activity: Yes    Partners: Male    Birth Control/ Protection: Inserts     Comment: nuvaring  Other Topics Concern  . Not on file   Social History Narrative  . No narrative on file    ROS:  Pertinent items are noted in HPI.  PHYSICAL EXAMINATION:    BP 130/84  Pulse 100  Ht 5\' 3"  (1.6 m)  Wt 158 lb 3.2 oz (71.759 kg)  BMI 28.03 kg/m2  LMP 10/25/2013   Wt Readings from Last 3 Encounters:  10/30/13 158 lb 3.2 oz (71.759 kg)  07/29/13 152 lb (68.947 kg)  01/21/13 152 lb (68.947 kg)     Ht Readings from Last 3 Encounters:  10/30/13 5\' 3"  (1.6 m)  07/29/13 5\' 3"  (1.6 m)  01/21/13 5' 3.5" (1.613 m)    General appearance: alert, cooperative and appears stated age Abdomen: soft, non-tender; no masses,  no organomegaly Extremities: extremities normal, atraumatic, no cyanosis or edema Skin: Skin color, texture, turgor normal. No rashes or lesions Lymph nodes: Cervical, supraclavicular, and axillary nodes normal. No abnormal inguinal nodes palpated Neurologic: Grossly normal  Pelvic: External genitalia:  no lesions               Urethra:  normal appearing urethra with no masses, tenderness or lesions              Bartholins and Skenes: normal                 Vagina: normal appearing vagina with normal color and discharge, no lesions, menstrual type flow noted with no heavy active bleeding or clotting.              Cervix: normal appearance                 Bimanual Exam:  Uterus:  uterus is normal size, shape, consistency and nontender                                      Adnexa: normal adnexa in size, nontender and fullness and tenderness on right size.  Left adnexal regions normal.                                     ASSESSMENT  Dysmenorrhea. Menorrhagia. NuvaRing patient. UPT negative.  Right adnexal fullness.  Suspect ovarian cyst.   PLAN  Serum quantitative beta HCG, CBC, TSH, CMP. Return for pelvic ultrasound.   An After Visit Summary was printed and given to the patient.  15 minutes face to face time of which over 50% was spent in counseling.

## 2013-10-31 LAB — HCG, QUANTITATIVE, PREGNANCY: hCG, Beta Chain, Quant, S: <2 m[IU]/mL

## 2013-11-01 NOTE — Addendum Note (Signed)
Addended by: Annamaria HellingAMUNDSON DE CARVALHO E SILVA, BROOK E on: 11/01/2013 06:17 PM   Modules accepted: Orders

## 2013-11-02 LAB — HEMOGLOBIN A1C
HEMOGLOBIN A1C: 5.5 % (ref <5.7)
MEAN PLASMA GLUCOSE: 111 mg/dL (ref <117)

## 2013-11-05 ENCOUNTER — Ambulatory Visit (INDEPENDENT_AMBULATORY_CARE_PROVIDER_SITE_OTHER): Payer: Managed Care, Other (non HMO)

## 2013-11-05 ENCOUNTER — Ambulatory Visit (INDEPENDENT_AMBULATORY_CARE_PROVIDER_SITE_OTHER): Payer: Managed Care, Other (non HMO) | Admitting: Obstetrics and Gynecology

## 2013-11-05 VITALS — BP 118/66 | Resp 18 | Ht 63.0 in | Wt 158.0 lb

## 2013-11-05 DIAGNOSIS — N920 Excessive and frequent menstruation with regular cycle: Secondary | ICD-10-CM

## 2013-11-05 DIAGNOSIS — N9489 Other specified conditions associated with female genital organs and menstrual cycle: Secondary | ICD-10-CM

## 2013-11-05 DIAGNOSIS — N946 Dysmenorrhea, unspecified: Secondary | ICD-10-CM

## 2013-11-05 DIAGNOSIS — N949 Unspecified condition associated with female genital organs and menstrual cycle: Secondary | ICD-10-CM

## 2013-11-05 NOTE — Progress Notes (Signed)
Subjective  Patient and her husband present for her pelvic ultrasound for heavy vaginal bleeding and pain, both of which have resolved.  Painful bowel movement resolved also.  Patient has been using NuvaRing and usually does not have symptoms of heavy or painful periods.  On pelvic exam on 10/30, a fullness was felt in the right adnexal region.  Hgb 12.2 on 10/30.  Takes iron daily but twice a day during menses.   Her mother has a history of endometriosis discovered at the time of hysterectomy.   Objective  See ultrasound - report and images reviewed with patient.  Normal uterus and ovaries - with multiple peripheral follicles.  No free fluid.      Assessment  Menorrhagia and dysmenorrhea resolved.  NuvaRing patient.  Normal pelvic ultrasound.   Plan  Reassurance given.  Discussed potential use of NuvaRing in a continuous fashion, but the patient desires to use it in a usual regular fashion and see if her cycles continue with clots and pain.  15 minutes face to face time of which over 50% was spent in counseling.   After visit summary to patient.

## 2013-11-05 NOTE — Patient Instructions (Signed)
Call if your periods continue to be heavy or painful.  We can do the continuous Nuva Ring then.

## 2013-11-08 ENCOUNTER — Encounter: Payer: Self-pay | Admitting: Obstetrics and Gynecology

## 2014-01-01 NOTE — L&D Delivery Note (Signed)
Delivery Note At 7:48 PM a viable and healthy female was delivered via  (Presentation: OA ).  APGAR: 9,9 ; weight-pending  .   Placenta status: Intact, Spontaneous.  Cord: 3 vessel. Was around the shoulder and baby was delivered through it. No complications:  Cord pH: N/A Anesthesia:  Epidural Episiotomy:  none Lacerations:  2nd degree perineal  Suture Repair: 3.0 vicryl rapide Est. Blood Loss (mL):  250 cc  Mom to postpartum.  Baby to Couplet care / Skin to Skin.  [name] 12/26/2014, 8:20 PM

## 2014-02-10 ENCOUNTER — Encounter (HOSPITAL_COMMUNITY)
Admission: EM | Disposition: A | Discharge status: Home / Self Care | Payer: Self-pay | Source: Home / Self Care | Attending: Emergency Medicine

## 2014-02-10 ENCOUNTER — Observation Stay (HOSPITAL_COMMUNITY)
Admission: EM | Admit: 2014-02-10 | Discharge: 2014-02-11 | Disposition: A | Discharge status: Home / Self Care | Payer: Managed Care, Other (non HMO) | Attending: General Surgery | Admitting: General Surgery

## 2014-02-10 ENCOUNTER — Encounter (HOSPITAL_COMMUNITY): Payer: Self-pay

## 2014-02-10 ENCOUNTER — Emergency Department (HOSPITAL_COMMUNITY): Payer: Managed Care, Other (non HMO)

## 2014-02-10 ENCOUNTER — Observation Stay (HOSPITAL_COMMUNITY): Payer: Managed Care, Other (non HMO) | Admitting: Anesthesiology

## 2014-02-10 DIAGNOSIS — K3580 Unspecified acute appendicitis: Secondary | ICD-10-CM | POA: Diagnosis not present

## 2014-02-10 DIAGNOSIS — R112 Nausea with vomiting, unspecified: Secondary | ICD-10-CM | POA: Diagnosis present

## 2014-02-10 DIAGNOSIS — Z888 Allergy status to other drugs, medicaments and biological substances status: Secondary | ICD-10-CM | POA: Diagnosis not present

## 2014-02-10 DIAGNOSIS — N200 Calculus of kidney: Secondary | ICD-10-CM | POA: Diagnosis not present

## 2014-02-10 DIAGNOSIS — R109 Unspecified abdominal pain: Secondary | ICD-10-CM | POA: Diagnosis present

## 2014-02-10 HISTORY — PX: LAPAROSCOPIC APPENDECTOMY: SHX408

## 2014-02-10 LAB — URINALYSIS, ROUTINE W REFLEX MICROSCOPIC
Bilirubin Urine: NEGATIVE
GLUCOSE, UA: NEGATIVE mg/dL
Hgb urine dipstick: NEGATIVE
KETONES UR: 40 mg/dL — AB
LEUKOCYTES UA: NEGATIVE
Nitrite: NEGATIVE
PH: 5.5 (ref 5.0–8.0)
Protein, ur: NEGATIVE mg/dL
Specific Gravity, Urine: 1.022 (ref 1.005–1.030)
Urobilinogen, UA: 0.2 mg/dL (ref 0.0–1.0)

## 2014-02-10 LAB — CBC WITH DIFFERENTIAL/PLATELET
BASOS PCT: 0 % (ref 0–1)
Basophils Absolute: 0 10*3/uL (ref 0.0–0.1)
Eosinophils Absolute: 0.1 10*3/uL (ref 0.0–0.7)
Eosinophils Relative: 0 % (ref 0–5)
HEMATOCRIT: 43.1 % (ref 36.0–46.0)
HEMOGLOBIN: 14.1 g/dL (ref 12.0–15.0)
LYMPHS ABS: 0.9 10*3/uL (ref 0.7–4.0)
LYMPHS PCT: 4 % — AB (ref 12–46)
MCH: 25.9 pg — ABNORMAL LOW (ref 26.0–34.0)
MCHC: 32.7 g/dL (ref 30.0–36.0)
MCV: 79.2 fL (ref 78.0–100.0)
MONO ABS: 1.2 10*3/uL — AB (ref 0.1–1.0)
MONOS PCT: 6 % (ref 3–12)
NEUTROS ABS: 19.8 10*3/uL — AB (ref 1.7–7.7)
NEUTROS PCT: 90 % — AB (ref 43–77)
Platelets: 427 10*3/uL — ABNORMAL HIGH (ref 150–400)
RBC: 5.44 MIL/uL — AB (ref 3.87–5.11)
RDW: 13.9 % (ref 11.5–15.5)
WBC: 22 10*3/uL — AB (ref 4.0–10.5)

## 2014-02-10 LAB — COMPREHENSIVE METABOLIC PANEL
ALBUMIN: 4.7 g/dL (ref 3.5–5.2)
ALT: 31 U/L (ref 0–35)
AST: 31 U/L (ref 0–37)
Alkaline Phosphatase: 87 U/L (ref 39–117)
Anion gap: 13 (ref 5–15)
BUN: 28 mg/dL — AB (ref 6–23)
CALCIUM: 9.8 mg/dL (ref 8.4–10.5)
CO2: 23 mmol/L (ref 19–32)
CREATININE: 1.01 mg/dL (ref 0.50–1.10)
Chloride: 100 mmol/L (ref 96–112)
GFR calc Af Amer: 88 mL/min — ABNORMAL LOW (ref >90)
GFR calc non Af Amer: 76 mL/min — ABNORMAL LOW (ref >90)
GLUCOSE: 159 mg/dL — AB (ref 70–99)
Potassium: 3.8 mmol/L (ref 3.5–5.1)
Sodium: 136 mmol/L (ref 135–145)
TOTAL PROTEIN: 8.5 g/dL — AB (ref 6.0–8.3)
Total Bilirubin: 0.3 mg/dL (ref 0.3–1.2)

## 2014-02-10 LAB — PREGNANCY, URINE: Preg Test, Ur: NEGATIVE

## 2014-02-10 LAB — LIPASE, BLOOD: LIPASE: 18 U/L (ref 11–59)

## 2014-02-10 SURGERY — APPENDECTOMY, LAPAROSCOPIC
Anesthesia: General | Site: Abdomen

## 2014-02-10 MED ORDER — DEXAMETHASONE SODIUM PHOSPHATE 10 MG/ML IJ SOLN
INTRAMUSCULAR | Status: AC
  Filled 2014-02-10: qty 1

## 2014-02-10 MED ORDER — DEXAMETHASONE SODIUM PHOSPHATE 10 MG/ML IJ SOLN
INTRAMUSCULAR | Status: DC | PRN
  Administered 2014-02-10: 10 mg via INTRAVENOUS

## 2014-02-10 MED ORDER — DEXTROSE 5 % IV SOLN
2.0000 g | INTRAVENOUS | Status: AC
  Administered 2014-02-10: 2 g via INTRAVENOUS
  Filled 2014-02-10: qty 2

## 2014-02-10 MED ORDER — ONDANSETRON HCL 4 MG/2ML IJ SOLN
INTRAMUSCULAR | Status: AC
  Administered 2014-02-10: 02:00:00
  Filled 2014-02-10: qty 2

## 2014-02-10 MED ORDER — LIDOCAINE HCL (CARDIAC) 20 MG/ML IV SOLN
INTRAVENOUS | Status: DC | PRN
  Administered 2014-02-10: 100 mg via INTRAVENOUS

## 2014-02-10 MED ORDER — ESMOLOL HCL 10 MG/ML IV SOLN
INTRAVENOUS | Status: DC | PRN
  Administered 2014-02-10: 15 mg via INTRAVENOUS

## 2014-02-10 MED ORDER — CEFOXITIN SODIUM 2 G IV SOLR
INTRAVENOUS | Status: AC
  Filled 2014-02-10: qty 2

## 2014-02-10 MED ORDER — 0.9 % SODIUM CHLORIDE (POUR BTL) OPTIME
TOPICAL | Status: DC | PRN
  Administered 2014-02-10: 1000 mL

## 2014-02-10 MED ORDER — PROPOFOL 10 MG/ML IV BOLUS
INTRAVENOUS | Status: AC
  Filled 2014-02-10: qty 20

## 2014-02-10 MED ORDER — PROMETHAZINE HCL 25 MG/ML IJ SOLN: 6.2500 mg | INTRAMUSCULAR | Status: DC | PRN

## 2014-02-10 MED ORDER — ROCURONIUM BROMIDE 100 MG/10ML IV SOLN
INTRAVENOUS | Status: AC
  Filled 2014-02-10: qty 1

## 2014-02-10 MED ORDER — ONDANSETRON HCL 4 MG/2ML IJ SOLN
INTRAMUSCULAR | Status: AC
  Filled 2014-02-10: qty 2

## 2014-02-10 MED ORDER — MIDAZOLAM HCL 5 MG/5ML IJ SOLN
INTRAMUSCULAR | Status: DC | PRN
  Administered 2014-02-10: 2 mg via INTRAVENOUS

## 2014-02-10 MED ORDER — OXYCODONE-ACETAMINOPHEN 5-325 MG PO TABS
1.0000 | ORAL_TABLET | ORAL | Status: DC | PRN
  Administered 2014-02-10 – 2014-02-11 (×3): 2 via ORAL
  Filled 2014-02-10 (×3): qty 2

## 2014-02-10 MED ORDER — FENTANYL CITRATE 0.05 MG/ML IJ SOLN
INTRAMUSCULAR | Status: AC
  Filled 2014-02-10: qty 2

## 2014-02-10 MED ORDER — DEXTROSE 5 % IV SOLN: 1.0000 g | Freq: Four times a day (QID) | INTRAVENOUS | Status: DC

## 2014-02-10 MED ORDER — FENTANYL CITRATE 0.05 MG/ML IJ SOLN
25.0000 ug | INTRAMUSCULAR | Status: DC | PRN
  Administered 2014-02-10: 25 ug via INTRAVENOUS

## 2014-02-10 MED ORDER — ACETAMINOPHEN 325 MG PO TABS
650.0000 mg | ORAL_TABLET | Freq: Once | ORAL | Status: AC
  Administered 2014-02-10: 650 mg via ORAL
  Filled 2014-02-10: qty 2

## 2014-02-10 MED ORDER — MEPERIDINE HCL 50 MG/ML IJ SOLN
6.2500 mg | INTRAMUSCULAR | Status: DC | PRN
Start: 2014-02-10 — End: 2014-02-10

## 2014-02-10 MED ORDER — MIDAZOLAM HCL 2 MG/2ML IJ SOLN
INTRAMUSCULAR | Status: AC
  Filled 2014-02-10: qty 2

## 2014-02-10 MED ORDER — IOHEXOL 300 MG/ML  SOLN
50.0000 mL | Freq: Once | INTRAMUSCULAR | Status: AC | PRN
  Administered 2014-02-10: 50 mL via ORAL

## 2014-02-10 MED ORDER — MORPHINE SULFATE 2 MG/ML IJ SOLN: 1.0000 mg | INTRAMUSCULAR | Status: DC | PRN

## 2014-02-10 MED ORDER — LACTATED RINGERS IV SOLN
INTRAVENOUS | Status: DC | PRN
  Administered 2014-02-10: 1000 mL via INTRAVENOUS

## 2014-02-10 MED ORDER — IOHEXOL 300 MG/ML  SOLN
100.0000 mL | Freq: Once | INTRAMUSCULAR | Status: AC | PRN
  Administered 2014-02-10: 100 mL via INTRAVENOUS

## 2014-02-10 MED ORDER — GLYCOPYRROLATE 0.2 MG/ML IJ SOLN
INTRAMUSCULAR | Status: DC | PRN
  Administered 2014-02-10: 0.6 mg via INTRAVENOUS

## 2014-02-10 MED ORDER — MORPHINE SULFATE 4 MG/ML IJ SOLN
INTRAMUSCULAR | Status: AC
  Administered 2014-02-10: 02:00:00
  Filled 2014-02-10: qty 1

## 2014-02-10 MED ORDER — ONDANSETRON HCL 4 MG/2ML IJ SOLN
INTRAMUSCULAR | Status: DC | PRN
  Administered 2014-02-10: 4 mg via INTRAVENOUS

## 2014-02-10 MED ORDER — POTASSIUM CHLORIDE IN NACL 20-0.9 MEQ/L-% IV SOLN
INTRAVENOUS | Status: DC
  Administered 2014-02-10: 1000 mL via INTRAVENOUS
  Administered 2014-02-10 (×2): via INTRAVENOUS
  Filled 2014-02-10 (×6): qty 1000

## 2014-02-10 MED ORDER — ROCURONIUM BROMIDE 100 MG/10ML IV SOLN
INTRAVENOUS | Status: DC | PRN
  Administered 2014-02-10: 30 mg via INTRAVENOUS

## 2014-02-10 MED ORDER — MORPHINE SULFATE 2 MG/ML IJ SOLN
1.0000 mg | INTRAMUSCULAR | Status: DC | PRN
  Administered 2014-02-10 (×2): 2 mg via INTRAVENOUS
  Filled 2014-02-10 (×2): qty 1

## 2014-02-10 MED ORDER — FENTANYL CITRATE 0.05 MG/ML IJ SOLN
INTRAMUSCULAR | Status: DC | PRN
  Administered 2014-02-10 (×2): 50 ug via INTRAVENOUS

## 2014-02-10 MED ORDER — NEOSTIGMINE METHYLSULFATE 10 MG/10ML IV SOLN
INTRAVENOUS | Status: DC | PRN
  Administered 2014-02-10: 4 mg via INTRAVENOUS

## 2014-02-10 MED ORDER — LACTATED RINGERS IV SOLN
INTRAVENOUS | Status: DC
Start: 2014-02-10 — End: 2014-02-10
  Administered 2014-02-10: 11:00:00 via INTRAVENOUS

## 2014-02-10 MED ORDER — LACTATED RINGERS IV SOLN
INTRAVENOUS | Status: DC
  Administered 2014-02-10: 10:00:00 via INTRAVENOUS
  Administered 2014-02-10: 1000 mL via INTRAVENOUS

## 2014-02-10 MED ORDER — SUCCINYLCHOLINE CHLORIDE 20 MG/ML IJ SOLN
INTRAMUSCULAR | Status: DC | PRN
  Administered 2014-02-10: 100 mg via INTRAVENOUS

## 2014-02-10 MED ORDER — FENTANYL CITRATE 0.05 MG/ML IJ SOLN
INTRAMUSCULAR | Status: AC
  Filled 2014-02-10: qty 5

## 2014-02-10 MED ORDER — BUPIVACAINE-EPINEPHRINE 0.25% -1:200000 IJ SOLN
INTRAMUSCULAR | Status: DC | PRN
  Administered 2014-02-10: 20 mL

## 2014-02-10 MED ORDER — PROPOFOL 10 MG/ML IV BOLUS
INTRAVENOUS | Status: DC | PRN
  Administered 2014-02-10: 150 mg via INTRAVENOUS

## 2014-02-10 MED ORDER — ONDANSETRON HCL 4 MG/2ML IJ SOLN: 4.0000 mg | Freq: Four times a day (QID) | INTRAMUSCULAR | Status: DC | PRN

## 2014-02-10 SURGICAL SUPPLY — 32 items
APPLIER CLIP ROT 10 11.4 M/L (STAPLE) ×2
CLIP APPLIE ROT 10 11.4 M/L (STAPLE) ×1 IMPLANT
CUTTER FLEX LINEAR 45M (STAPLE) ×2 IMPLANT
DECANTER SPIKE VIAL GLASS SM (MISCELLANEOUS) IMPLANT
DRAPE LAPAROSCOPIC ABDOMINAL (DRAPES) ×2 IMPLANT
DRAPE UTILITY XL STRL (DRAPES) ×2 IMPLANT
ELECT REM PT RETURN 9FT ADLT (ELECTROSURGICAL) ×2
ELECTRODE REM PT RTRN 9FT ADLT (ELECTROSURGICAL) ×1 IMPLANT
ENDOLOOP SUT PDS II  0 18 (SUTURE)
ENDOLOOP SUT PDS II 0 18 (SUTURE) IMPLANT
GLOVE BIO SURGEON STRL SZ7.5 (GLOVE) ×2 IMPLANT
GOWN STRL REUS W/ TWL XL LVL3 (GOWN DISPOSABLE) ×1 IMPLANT
GOWN STRL REUS W/TWL XL LVL3 (GOWN DISPOSABLE) ×5 IMPLANT
IV LACTATED RINGERS 1000ML (IV SOLUTION) ×2 IMPLANT
KIT BASIN OR (CUSTOM PROCEDURE TRAY) ×2 IMPLANT
LIQUID BAND (GAUZE/BANDAGES/DRESSINGS) ×2 IMPLANT
NS IRRIG 1000ML POUR BTL (IV SOLUTION) ×2 IMPLANT
PENCIL BUTTON HOLSTER BLD 10FT (ELECTRODE) ×2 IMPLANT
POUCH SPECIMEN RETRIEVAL 10MM (ENDOMECHANICALS) ×2 IMPLANT
RELOAD 45 VASCULAR/THIN (ENDOMECHANICALS) IMPLANT
RELOAD STAPLE TA45 3.5 REG BLU (ENDOMECHANICALS) ×2 IMPLANT
SET IRRIG TUBING LAPAROSCOPIC (IRRIGATION / IRRIGATOR) ×2 IMPLANT
SHEARS HARMONIC ACE PLUS 36CM (ENDOMECHANICALS) ×2 IMPLANT
SOLUTION ANTI FOG 6CC (MISCELLANEOUS) ×2 IMPLANT
SUT MNCRL AB 4-0 PS2 18 (SUTURE) ×2 IMPLANT
TOWEL OR 17X26 10 PK STRL BLUE (TOWEL DISPOSABLE) ×2 IMPLANT
TRAY FOLEY CATH 14FRSI W/METER (CATHETERS) ×2 IMPLANT
TRAY LAPAROSCOPIC (CUSTOM PROCEDURE TRAY) ×2 IMPLANT
TROCAR BLADELESS OPT 5 75 (ENDOMECHANICALS) ×2 IMPLANT
TROCAR SLEEVE XCEL 5X75 (ENDOMECHANICALS) ×2 IMPLANT
TROCAR XCEL BLUNT TIP 100MML (ENDOMECHANICALS) ×2 IMPLANT
TUBING INSUFFLATION 10FT LAP (TUBING) ×2 IMPLANT

## 2014-02-10 NOTE — Transfer of Care (Signed)
Immediate Anesthesia Transfer of Care Note  Patient: Nancy Wells  Procedure(s) Performed: Procedure(s): APPENDECTOMY LAPAROSCOPIC (N/A)  Patient Location: PACU  Anesthesia Type:General  Level of Consciousness: sedated  Airway & Oxygen Therapy: Patient Spontanous Breathing and Patient connected to face mask oxygen  Post-op Assessment: Report given to RN and Post -op Vital signs reviewed and stable  Post vital signs: Reviewed and stable  Last Vitals:  Filed Vitals:   02/10/14 0732  BP: 107/67  Pulse: 113  Temp:   Resp: 19    Complications: No apparent anesthesia complications

## 2014-02-10 NOTE — Anesthesia Postprocedure Evaluation (Signed)
Anesthesia Post Note  Patient: Nancy Wells  Procedure(s) Performed: Procedure(s) (LRB): APPENDECTOMY LAPAROSCOPIC (N/A)  Anesthesia type: general  Patient location: PACU  Post pain: Pain level controlled  Post assessment: Patient's Cardiovascular Status Stable  Last Vitals:  Filed Vitals:   02/10/14 1401  BP: 101/62  Pulse: 105  Temp: 37.5 C  Resp: 18    Post vital signs: Reviewed and stable  Level of consciousness: sedated  Complications: No apparent anesthesia complications

## 2014-02-10 NOTE — Op Note (Signed)
02/10/2014  10:15 AM  PATIENT:  Nancy Wells  29 y.o. female  PRE-OPERATIVE DIAGNOSIS:  appendicitis  POST-OPERATIVE DIAGNOSIS:  appendicitis  PROCEDURE:  Procedure(s): APPENDECTOMY LAPAROSCOPIC (N/A)  SURGEON:  Surgeon(s) and Role:    * Griselda Miner, MD - Primary  PHYSICIAN ASSISTANT:   ASSISTANTS: none   ANESTHESIA:   general  EBL:  Total I/O In: 1000 [I.V.:1000] Out: 350 [Urine:350]  BLOOD ADMINISTERED:none  DRAINS: none   LOCAL MEDICATIONS USED:  MARCAINE     SPECIMEN:  Source of Specimen:  appendix  DISPOSITION OF SPECIMEN:  PATHOLOGY  COUNTS:  YES  TOURNIQUET:  * No tourniquets in log *  DICTATION: .Dragon Dictation  After informed consent was obtained patient was brought to the operating room placed in the supine position on the operating room table. After adequate induction of general anesthesia the patient's abdomen was prepped with ChloraPrep, allowed to dry, and draped in usual sterile manner. The area below the umbilicus was infiltrated with quarter percent Marcaine. A small incision was made with a 15 blade knife. This incision was carried down through the subcutaneous tissue bluntly with a hemostat and Army-Nav[employer] retractors until the linea alba was identified. The linea alba was incised with a 15 blade knife. Each side was grasped Coker clamps and elevated anteriorly. The preperitoneal space was probed bluntly with a hemostat until the peritoneum was opened and access was gained to the abdominal cavity. A 0 Vicryl purse string stitch was placed in the fascia surrounding the opening. A Hassan cannula was placed through the opening and anchored in place with the previously placed Vicryl purse string stitch. The laparoscope was placed through the [facility] cannula. The abdomen was insufflated with carbon dioxide without difficulty. Next the suprapubic area was infiltrated with quarter percent Marcaine. A small incision was made with a 15 blade knife. A 5 mm port was  placed bluntly through this incision into the abdominal cavity. A site was then chosen between the 2 port for placement of a 5 mm port. The area was infiltrated with quarter percent Marcaine. A small stab incision was made with a 15 blade knife. A 5 mm port was placed bluntly through this incision and the abdominal cavity under direct vision. The laparoscope was then moved to the suprapubic port. Using a Glassman grasper and harmonic scalpel the right lower quadrant was inspected. The appendix was readily identified. The appendix was elevated anteriorly and the mesoappendix was taken down sharply with the harmonic scalpel. Once the base of the appendix where it joined the cecum was identified and cleared of any tissue then a laparoscopic GIA blue load 6 row stapler was placed through the [facility] cannula. The stapler was placed across the base of the appendix clamped and fired thereby dividing the base of the appendix between staple lines. There was a small amount of bleeding from the staple line which was controlled with a clip. A laparoscopic bag was then inserted through the [facility] cannula. The appendix was placed within the bag and the bag was sealed. The abdomen was then irrigated with copious amounts of saline until the effluent was clear. No other abnormalities were noted. The appendix and bag were removed with the [facility] cannula through the infraumbilical port without difficulty. The fascial defect was closed with the previously placed Vicryl pursestring stitch as well as with another interrupted 0 Vicryl figure-of-eight stitch. The rest of the ports were removed under direct vision and were found to be hemostatic. The gas was allowed  to escape. The skin incisions were closed with interrupted 4-0 Monocryl subcuticular stitches. Dermabond dressings were applied. The patient tolerated the procedure well. At the end of the case all needle sponge and instrument counts were correct. The patient was then awakened  and taken to recovery in stable condition.  PLAN OF CARE: Admit for overnight observation  PATIENT DISPOSITION:  PACU - hemodynamically stable.   Delay start of Pharmacological VTE agent (>24hrs) due to surgical blood loss or risk of bleeding: no

## 2014-02-10 NOTE — ED Notes (Signed)
Pt scheduled to go to OR this am, surgery at bedside speaking with pt.

## 2014-02-10 NOTE — ED Provider Notes (Signed)
CSN: [id number]     Arrival date & time 02/10/14  0019 History   First MD Initiated Contact with Patient 02/10/14 0222     No chief complaint on file.  (Consider location/radiation/quality/duration/timing/severity/associated sxs/prior Treatment) HPI Nancy Wells is a 29 yo with report of nausea, vomiting, and diarrhea. She states this started around 7 PM tonight, after she finished eating dinner. She notes that she first felt some abdominal cramping and discomfort followed by multiple episodes of vomiting and diarrhea. She estimates roughly 20 episodes of both. She states the pain in her abdomen is only when she's going to have an episode of vomiting or diarrhea. She denies any fevers, chills, dysuria, vaginal symptoms, bilious/bloody emesis or hematachezia/melena.  Past Medical History  Diagnosis Date  . Anemia   . Migraines     no aura  . Pyelonephritis 2010  . Trigeminal neuralgia    Past Surgical History  Procedure Laterality Date  . Wisdom tooth extraction     Family History  Problem Relation Age of Onset  . Osteoporosis Mother   . Cancer Father     skin  . Breast cancer Paternal Grandmother    History  Substance Use Topics  . Smoking status: Never Smoker   . Smokeless tobacco: Not on file  . Alcohol Use: 2.0 oz/week    4 drink(s) per week   OB History    Gravida Para Term Preterm AB TAB SAB Ectopic Multiple Living       Review of Systems  Constitutional: Negative for fever and chills.  HENT: Negative for sore throat.   Eyes: Negative for visual disturbance.  Respiratory: Negative for cough and shortness of breath.   Cardiovascular: Negative for chest pain and leg swelling.  Gastrointestinal: Positive for nausea, vomiting, abdominal pain and diarrhea.  Genitourinary: Negative for dysuria.  Musculoskeletal: Negative for myalgias.  Skin: Negative for rash.  Neurological: Negative for weakness, numbness and headaches.      Allergies    Tegretol  Home Medications   Prior to Admission medications   Medication Sig Start Date End Date Taking? Authorizing Provider  calcium carbonate (TUMS - DOSED IN MG ELEMENTAL CALCIUM) 500 MG chewable tablet Chew 2 tablets by mouth daily as needed for indigestion or heartburn.   Yes Historical Provider, MD  ibuprofen (ADVIL,MOTRIN) 200 MG tablet Take 400-600 mg by mouth every 6 (six) hours as needed (for pain).   Yes Historical Provider, MD  Prenatal Vit-Fe Fumarate-FA (PRENATAL MULTIVITAMIN) TABS tablet Take 1 tablet by mouth daily at 12 noon.   Yes Historical Provider, MD  etonogestrel-ethinyl estradiol (NUVARING) 0.12-0.015 MG/24HR vaginal ring Place 1 each vaginally every 28 (twenty-eight) days. Insert vaginally and leave in place for 3 consecutive weeks, then remove for 1 week. Patient not taking: Reported on 02/10/2014 07/29/13   Verner Chol, CNM  ibuprofen (ADVIL,MOTRIN) 600 MG tablet Take 1 tablet (600 mg total) by mouth every 8 (eight) hours as needed for pain. Patient not taking: Reported on 02/10/2014 04/23/12   Sharin Grave, MD   There were no vitals taken for this visit. Physical Exam  Constitutional: She is oriented to person, place, and time. She appears well-developed and well-nourished. No distress.  HENT:  Head: Normocephalic and atraumatic.  Mouth/Throat: Mucous membranes are dry. No oropharyngeal exudate.  Eyes: Conjunctivae are normal.  Neck: Neck supple. No thyromegaly present.  Cardiovascular: Normal rate, regular rhythm and intact distal pulses.   Pulmonary/Chest: Effort  normal and breath sounds normal. No respiratory distress. She has no wheezes. She has no rales. She exhibits no tenderness.  Abdominal: Soft. She exhibits no distension and no mass. There is no hepatosplenomegaly. There is tenderness in the periumbilical area. There is no rigidity, no rebound, no guarding, no CVA tenderness, no tenderness at McBurney's point and negative Murphy's sign.     Musculoskeletal: She exhibits no tenderness.  Lymphadenopathy:    She has no cervical adenopathy.  Neurological: She is alert and oriented to person, place, and time.  Skin: Skin is warm and dry. No rash noted. She is not diaphoretic.  Psychiatric: She has a normal mood and affect.  Nursing note and vitals reviewed.   ED Course  Procedures (including critical care time) Labs Review Labs Reviewed  CBC WITH DIFFERENTIAL/PLATELET - Abnormal; Notable for the following:    WBC 22.0 (*)    RBC 5.44 (*)    MCH 25.9 (*)    Platelets 427 (*)    Neutrophils Relative % 90 (*)    Neutro Abs 19.8 (*)    Lymphocytes Relative 4 (*)    Monocytes Absolute 1.2 (*)    All other components within normal limits  COMPREHENSIVE METABOLIC PANEL - Abnormal; Notable for the following:    Glucose, Bld 159 (*)    BUN 28 (*)    Total Protein 8.5 (*)    GFR calc non Af Amer 76 (*)    GFR calc Af Amer 88 (*)    All other components within normal limits  URINALYSIS, ROUTINE W REFLEX MICROSCOPIC - Abnormal; Notable for the following:    APPearance CLOUDY (*)    Ketones, ur 40 (*)    All other components within normal limits  LIPASE, BLOOD  PREGNANCY, URINE  URINALYSIS, ROUTINE W REFLEX MICROSCOPIC    Imaging Review Ct Abdomen Pelvis W Contrast  02/10/2014   CLINICAL DATA:  Acute onset of generalized abdominal pain. Initial encounter.  EXAM: CT ABDOMEN AND PELVIS WITH CONTRAST  TECHNIQUE: Multidetector CT imaging of the abdomen and pelvis was performed using the standard protocol following bolus administration of intravenous contrast.  CONTRAST:  OMNIPAQUE IOHEXOL 300 MG/ML  SOLN  COMPARISON:  None.  FINDINGS: The visualized lung bases are clear.  A 6 mm hypodensity is noted within the right hepatic lobe. The liver and spleen are otherwise unremarkable. The gallbladder is within normal limits. The pancreas and adrenal glands are unremarkable.  Two small nonobstructing right renal stones are seen,  measuring up to 3 mm in size, at the upper pole of the right kidney. There is no evidence of hydronephrosis. No obstructing ureteral stones are seen. No perinephric stranding is appreciated.  No free fluid is identified. The small bowel is unremarkable in appearance. The stomach is within normal limits. No acute vascular abnormalities are seen.  The appendix is mildly distended, measuring up to 8 mm, with a 1.1 cm appendicolith at the mid appendix. No significant soft tissue inflammation is seen, but this raises question for mild acute appendicitis. There is mildly increased appendiceal wall enhancement. The appendix is retrocecal and noted along the right pelvic sidewall, as the cecum is situated within the right hemipelvis.  The colon is partially filled with fluid, though the descending and sigmoid colon are decompressed. The colon is unremarkable.  The bladder is mildly distended, and somewhat compressed by the adjacent cecum. The bladder is grossly unremarkable. The uterus is unremarkable in appearance. The ovaries are relatively symmetric, with a  small right-sided follicle seen. No suspicious adnexal masses are identified. No inguinal lymphadenopathy is seen.  No acute osseous abnormalities are identified.  IMPRESSION: 1. Mild distention of the appendix to 8 mm, with a 1.1 cm appendicolith at the mid appendix. No significant soft tissue inflammation is seen, but this raises question for mild acute appendicitis. Mildly increased appendiceal wall enhancement noted. The appendix is retrocecal and seen along the right pelvic sidewall, as the cecum is situated within the right hemipelvis. 2. Two nonobstructing small right renal stones seen, measuring up to 3 mm in size. 3. 6 mm nonspecific hypodensity within the right hepatic lobe.  These results were called by telephone at the time of interpretation on 02/10/2014 at 5:38 am to [facility] Impar[facility], who verbally acknowledged these results.   Electronically Signed    By: Roanna RaiderJeffery  Chang M.D.   On: 02/10/2014 05:40     EKG Interpretation None      MDM   Final diagnoses:  Acute appendicitis, unspecified acute appendicitis type   29 yo with nausea, vomiting and diarrhea onset tonight after dinner.  She reports only cramping, generalized abdominal pain, some mild focal peri-umbilical tenderness. Consider gastroenterisitis vs appendicitis. Protocol labs already collected CBC, CMP, Lipase, UA Upreg, Zofran and NS bolus.  Due to amount of volume loss, a second liter given and morphine 4mg  IV and another dose of zofran given.  2:05 AM: Vitals on my exam: 104/68, 108, 16, 98.4, 99% RA  2:30 AM Labs reviewed: WBC elevated to 22.0, CT abd/pelvis ordered, discussed case with Dr. Mora Bellmanni   4:05 AM Pt returned from CT because no urine resulted.  UA and Upreg re-ordered.   5:30 AM: Vitals taken by me: 99/54, 101, 18, 98.8, 99% RA  6:00 AM: CT resulted, possible early acute appendicitis. Surgery paged  6:15 AM: Consulted Dr. Johna SheriffHoxworth (surgery) regarding pt's status, labs and CT findings. Surgical PA will come to eval. Pt and family made aware of surgery coming to eval.  6:15 AM: At end of shift, hand-off report given to [facility]ki, PA-C.  Plan includes follow recommendations for surgery.        Harle BattiestElizabeth Tyaira Heward, NP 02/10/14 1616  Tomasita CrumbleAdeleke Oni, MD 02/10/14 [phone]

## 2014-02-10 NOTE — Anesthesia Preprocedure Evaluation (Signed)
Anesthesia Evaluation  Patient identified by MRN, date of birth, ID band Patient awake    Reviewed: Allergy & Precautions, NPO status , Patient's Chart, lab work & pertinent test results  Airway Mallampati: II  TM Distance: >3 FB Neck ROM: Full    Dental no notable dental hx.    Pulmonary neg pulmonary ROS,  breath sounds clear to auscultation  Pulmonary exam normal       Cardiovascular negative cardio ROS  Rhythm:Regular Rate:Normal     Neuro/Psych negative neurological ROS  negative psych ROS   GI/Hepatic negative GI ROS, Neg liver ROS,   Endo/Other  negative endocrine ROS  Renal/GU negative Renal ROS  negative genitourinary   Musculoskeletal negative musculoskeletal ROS (+)   Abdominal   Peds negative pediatric ROS (+)  Hematology negative hematology ROS (+)   Anesthesia Other Findings   Reproductive/Obstetrics negative OB ROS                             Anesthesia Physical Anesthesia Plan  ASA: I  Anesthesia Plan: General   Post-op Pain Management:    Induction: Intravenous  Airway Management Planned: Oral ETT  Additional Equipment:   Intra-op Plan:   Post-operative Plan: Extubation in OR  Informed Consent: I have reviewed the patients History and Physical, chart, labs and discussed the procedure including the risks, benefits and alternatives for the proposed anesthesia with the patient or authorized representative who has indicated his/her understanding and acceptance.   Dental advisory given  Plan Discussed with: CRNA  Anesthesia Plan Comments:         Anesthesia Quick Evaluation  

## 2014-02-10 NOTE — Interval H&P Note (Signed)
History and Physical Interval Note:  02/10/2014 9:00 AM  Nancy Wells  has presented today for surgery, with the diagnosis of appendicitis  The various methods of treatment have been discussed with the patient and family. After consideration of risks, benefits and other options for treatment, the patient has consented to  Procedure(s): APPENDECTOMY LAPAROSCOPIC (N/A) as a surgical intervention .  The patient's history has been reviewed, patient examined, no change in status, stable for surgery.  I have reviewed the patient's chart and labs.  Questions were answered to the patient's satisfaction.     TOTH III,PAUL S

## 2014-02-10 NOTE — H&P (Signed)
Nancy Wells is an 29 y.o. female.   Chief Complaint: abdominal bloating, nausea and vomiting since last PM HPI:  Healty 29 y/o who felt overly full yesterday after lunch, but not bad.  She came home, had supper and a glass of wine.  Went to the bath room and developed diarrhea, nausea and vomiting.  This kept happening all evening about every 10 min.  Her husband finally made her come to the ED.  She says she never had any real abdominal pain, she just felt bloated and uncomfortable, like she had eaten to much.   Work up in the ED shows she is afebrile, BP 99/54.  Labs show WBC 22K, left shift.  Glucose up to 159 and bun up to 28.  UA negative/pregnanucy test negative.  CT shows:  The appendix is mildly distended, measuring up to 8 mm, with a 1.1 cm appendicolith at the mid appendix. No significant soft tissue inflammation is seen, but this raises question for mild acute appendicitis. There is mildly increased appendiceal wall enhancement. The appendix is retrocecal and noted along the right pelvic sidewall, as the cecum is situated within the right hemipelvis. Two nonobstructing small right renal stones seen, measuring up to 3 mm in size. A 6 mm nonspecific hypodensity within the right hepatic lobe. WE are ask to see.  She still does not really have much pain, just some distended like feeling, ? Minimal discomfort RLQ.  Past Medical History  Diagnosis Date  Anemia   Tension headaches    no aura  Pyelonephritis 2010  Trigeminal neuralgia     Past Surgical History  Procedure Laterality Date  . Wisdom tooth extraction      Family History  Problem Relation Age of Onset  . Osteoporosis Mother   . Cancer Father     skin  . Breast cancer Paternal Grandmother    Social History:  reports that she has never smoked. She does not have any smokeless tobacco history on file. She reports that she drinks about 2.0 oz of alcohol per week. She reports that she does not use illicit drugs.  Allergies:   Allergies  Allergen Reactions  . Tegretol [Carbamazepine]     Unable to walk/ unable to see     Prior to Admission medications   Medication Sig Start Date End Date Taking? Authorizing Provider  calcium carbonate (TUMS - DOSED IN MG ELEMENTAL CALCIUM) 500 MG chewable tablet Chew 2 tablets by mouth daily as needed for indigestion or heartburn.   Yes Historical Provider, MD  ibuprofen (ADVIL,MOTRIN) 200 MG tablet Take 400-600 mg by mouth every 6 (six) hours as needed (for pain).   Yes Historical Provider, MD  Prenatal Vit-Fe Fumarate-FA (PRENATAL MULTIVITAMIN) TABS tablet Take 1 tablet by mouth daily at 12 noon.   Yes Historical Provider, MD  etonogestrel-ethinyl estradiol (NUVARING) 0.12-0.015 MG/24HR vaginal ring Place 1 each vaginally every 28 (twenty-eight) days. Insert vaginally and leave in place for 3 consecutive weeks, then remove for 1 week. Patient not taking: Reported on 02/10/2014 07/29/13   Regina Eck, CNM  ibuprofen (ADVIL,MOTRIN) 600 MG tablet Take 1 tablet (600 mg total) by mouth every 8 (eight) hours as needed for pain. Patient not taking: Reported on 02/10/2014 04/23/12   Randa Spike, MD     Results for orders placed or performed during the hospital encounter of 02/10/14 (from the past 48 hour(s))  CBC with Differential/Platelet     Status: Abnormal   Collection Time: 02/10/14 12:40 AM  Result Value Ref Range   WBC 22.0 (H) 4.0 - 10.5 K/uL   RBC 5.44 (H) 3.87 - 5.11 MIL/uL   Hemoglobin 14.1 12.0 - 15.0 g/dL   HCT 43.1 36.0 - 46.0 %   MCV 79.2 78.0 - 100.0 fL   MCH 25.9 (L) 26.0 - 34.0 pg   MCHC 32.7 30.0 - 36.0 g/dL   RDW 13.9 11.5 - 15.5 %   Platelets 427 (H) 150 - 400 K/uL   Neutrophils Relative % 90 (H) 43 - 77 %   Neutro Abs 19.8 (H) 1.7 - 7.7 K/uL   Lymphocytes Relative 4 (L) 12 - 46 %   Lymphs Abs 0.9 0.7 - 4.0 K/uL   Monocytes Relative 6 3 - 12 %   Monocytes Absolute 1.2 (H) 0.1 - 1.0 K/uL   Eosinophils Relative 0 0 - 5 %   Eosinophils Absolute  0.1 0.0 - 0.7 K/uL   Basophils Relative 0 0 - 1 %   Basophils Absolute 0.0 0.0 - 0.1 K/uL  Comprehensive metabolic panel     Status: Abnormal   Collection Time: 02/10/14 12:40 AM  Result Value Ref Range   Sodium 136 135 - 145 mmol/L   Potassium 3.8 3.5 - 5.1 mmol/L   Chloride 100 96 - 112 mmol/L   CO2 23 19 - 32 mmol/L   Glucose, Bld 159 (H) 70 - 99 mg/dL   BUN 28 (H) 6 - 23 mg/dL   Creatinine, Ser 1.01 0.50 - 1.10 mg/dL   Calcium 9.8 8.4 - 10.5 mg/dL   Total Protein 8.5 (H) 6.0 - 8.3 g/dL   Albumin 4.7 3.5 - 5.2 g/dL   AST 31 0 - 37 U/L   ALT 31 0 - 35 U/L   Alkaline Phosphatase 87 39 - 117 U/L   Total Bilirubin 0.3 0.3 - 1.2 mg/dL   GFR calc non Af Amer 76 (L) >90 mL/min   GFR calc Af Amer 88 (L) >90 mL/min    Comment: (NOTE) The eGFR has been calculated using the CKD EPI equation. This calculation has not been validated in all clinical situations. eGFR's persistently <90 mL/min signify possible Chronic Kidney Disease.    Anion gap 13 5 - 15  Lipase, blood     Status: None   Collection Time: 02/10/14 12:40 AM  Result Value Ref Range   Lipase 18 11 - 59 U/L  Urinalysis, Routine w reflex microscopic     Status: Abnormal   Collection Time: 02/10/14  4:40 AM  Result Value Ref Range   Color, Urine YELLOW YELLOW   APPearance CLOUDY (A) CLEAR   Specific Gravity, Urine 1.022 1.005 - 1.030   pH 5.5 5.0 - 8.0   Glucose, UA NEGATIVE NEGATIVE mg/dL   Hgb urine dipstick NEGATIVE NEGATIVE   Bilirubin Urine NEGATIVE NEGATIVE   Ketones, ur 40 (A) NEGATIVE mg/dL   Protein, ur NEGATIVE NEGATIVE mg/dL   Urobilinogen, UA 0.2 0.0 - 1.0 mg/dL   Nitrite NEGATIVE NEGATIVE   Leukocytes, UA NEGATIVE NEGATIVE    Comment: MICROSCOPIC NOT DONE ON URINES WITH NEGATIVE PROTEIN, BLOOD, LEUKOCYTES, NITRITE, OR GLUCOSE <1000 mg/dL.  Pregnancy, urine     Status: None   Collection Time: 02/10/14  4:40 AM  Result Value Ref Range   Preg Test, Ur NEGATIVE NEGATIVE    Comment:        THE SENSITIVITY  OF THIS METHODOLOGY IS >20 mIU/mL.    Ct Abdomen Pelvis W Contrast  02/10/2014   CLINICAL  DATA:  Acute onset of generalized abdominal pain. Initial encounter.  EXAM: CT ABDOMEN AND PELVIS WITH CONTRAST  TECHNIQUE: Multidetector CT imaging of the abdomen and pelvis was performed using the standard protocol following bolus administration of intravenous contrast.  CONTRAST:  135m OMNIPAQUE IOHEXOL 300 MG/ML  SOLN  COMPARISON:  None.  FINDINGS: The visualized lung bases are clear.  A 6 mm hypodensity is noted within the right hepatic lobe. The liver and spleen are otherwise unremarkable. The gallbladder is within normal limits. The pancreas and adrenal glands are unremarkable.  Two small nonobstructing right renal stones are seen, measuring up to 3 mm in size, at the upper pole of the right kidney. There is no evidence of hydronephrosis. No obstructing ureteral stones are seen. No perinephric stranding is appreciated.  No free fluid is identified. The small bowel is unremarkable in appearance. The stomach is within normal limits. No acute vascular abnormalities are seen.  The appendix is mildly distended, measuring up to 8 mm, with a 1.1 cm appendicolith at the mid appendix. No significant soft tissue inflammation is seen, but this raises question for mild acute appendicitis. There is mildly increased appendiceal wall enhancement. The appendix is retrocecal and noted along the right pelvic sidewall, as the cecum is situated within the right hemipelvis.  The colon is partially filled with fluid, though the descending and sigmoid colon are decompressed. The colon is unremarkable.  The bladder is mildly distended, and somewhat compressed by the adjacent cecum. The bladder is grossly unremarkable. The uterus is unremarkable in appearance. The ovaries are relatively symmetric, with a small right-sided follicle seen. No suspicious adnexal masses are identified. No inguinal lymphadenopathy is seen.  No acute osseous  abnormalities are identified.  IMPRESSION: 1. Mild distention of the appendix to 8 mm, with a 1.1 cm appendicolith at the mid appendix. No significant soft tissue inflammation is seen, but this raises question for mild acute appendicitis. Mildly increased appendiceal wall enhancement noted. The appendix is retrocecal and seen along the right pelvic sidewall, as the cecum is situated within the right hemipelvis. 2. Two nonobstructing small right renal stones seen, measuring up to 3 mm in size. 3. 6 mm nonspecific hypodensity within the right hepatic lobe.  These results were called by telephone at the time of interpretation on 02/10/2014 at 5:38 am [facility] verbally acknowledged these results.   Electronically Signed   By: JGarald BaldingM.D.   On: 02/10/2014 05:40    Review of Systems  Constitutional: Positive for chills (chills and shakes after vomiting started). Negative for fever, weight loss, malaise/fatigue and diaphoresis.  Eyes: Negative.   Respiratory: Negative.   Cardiovascular: Negative.   Gastrointestinal: Positive for heartburn (occasional), nausea and vomiting.       No real pain just bloating, kind of diffuse over entire abdomen.  Genitourinary: Negative.   Musculoskeletal: Negative.   Skin: Negative.   Neurological: Positive for headaches (tension headaches). Negative for weakness.  Endo/Heme/Allergies: Negative.   Psychiatric/Behavioral: Negative.     Blood pressure 107/67, pulse 113, temperature 98.3 F (36.8 C), temperature source Oral, resp. rate 19, last menstrual period 01/26/2014, SpO2 100 %. Physical Exam  Constitutional: She is oriented to person, place, and time. She appears well-developed and well-nourished. No distress.  HENT:  Head: Normocephalic and atraumatic.  Nose: Nose normal.  Eyes: Conjunctivae and EOM are normal. Right eye exhibits no discharge. Left eye exhibits no discharge. No scleral icterus.  Neck: Normal range of motion. Neck  supple. No JVD present. No tracheal deviation present. No thyromegaly present.  Cardiovascular: Normal rate, regular rhythm and normal heart sounds.  Exam reveals no gallop.   No murmur heard. Respiratory: Effort normal and breath sounds normal. No respiratory distress. She has no wheezes. She has no rales. She exhibits no tenderness.  GI: Soft. Bowel sounds are normal. She exhibits no distension and no mass. There is tenderness (?RLQ, more of a bloated feeling). There is no rebound and no guarding.  She may be a little tender in RLQ, but not much.  Musculoskeletal: She exhibits no edema or tenderness.  Lymphadenopathy:    She has no cervical adenopathy.  Neurological: She is alert and oriented to person, place, and time. No cranial nerve deficit.  Skin: Skin is warm and dry. No rash noted. She is not diaphoretic. No erythema. No pallor.  Psychiatric: She has a normal mood and affect. Her behavior is normal. Judgment and thought content normal.     Assessment/Plan Early acute appendicitis  Plan:  Admit to observation, appendectomy, IV antibiotics, IV hydration.  Christophere Hillhouse 02/10/2014, 7:36 AM

## 2014-02-11 ENCOUNTER — Encounter (HOSPITAL_COMMUNITY): Payer: Self-pay | Admitting: *Deleted

## 2014-02-11 LAB — COMPREHENSIVE METABOLIC PANEL
ALBUMIN: 2.9 g/dL — AB (ref 3.5–5.2)
ALT: 22 U/L (ref 0–35)
AST: 19 U/L (ref 0–37)
Alkaline Phosphatase: 48 U/L (ref 39–117)
Anion gap: 8 (ref 5–15)
BUN: 9 mg/dL (ref 6–23)
CALCIUM: 8.4 mg/dL (ref 8.4–10.5)
CO2: 23 mmol/L (ref 19–32)
CREATININE: 0.55 mg/dL (ref 0.50–1.10)
Chloride: 108 mmol/L (ref 96–112)
GFR calc Af Amer: >90 mL/min (ref >90)
GFR calc non Af Amer: >90 mL/min (ref >90)
Glucose, Bld: 88 mg/dL (ref 70–99)
Potassium: 3.7 mmol/L (ref 3.5–5.1)
Sodium: 139 mmol/L (ref 135–145)
Total Bilirubin: 0.5 mg/dL (ref 0.3–1.2)
Total Protein: 5.4 g/dL — ABNORMAL LOW (ref 6.0–8.3)

## 2014-02-11 MED ORDER — OXYCODONE-ACETAMINOPHEN 5-325 MG PO TABS: 1.0000 | ORAL_TABLET | ORAL | Status: DC | PRN

## 2014-02-11 MED ORDER — IBUPROFEN 200 MG PO TABS: 600.0000 mg | ORAL_TABLET | Freq: Four times a day (QID) | ORAL | Status: DC | PRN

## 2014-02-11 NOTE — Progress Notes (Signed)
UR completed 

## 2014-02-11 NOTE — Progress Notes (Signed)
1 Day Post-Op  Subjective: She looks good, I will advance her diet get her up and walking more.  Aim or discharge later today.  Objective: Vital signs in last 24 hours: Temp:  [97.7 F (36.5 C)-99.5 F (37.5 C)] 97.7 F (36.5 C) (02/11 0645) Pulse Rate:  [83-105] 83 (02/11 0645) Resp:  [12-24] 14 (02/11 0645) BP: (88-107)/(44-66) 88/44 mmHg (02/11 0645) SpO2:  [98 %-100 %] 99 % (02/11 0645) Weight:  [77.021 kg (169 lb 12.8 oz)] 77.021 kg (169 lb 12.8 oz) (02/11 0645) Last BM Date: 02/09/14 460 PO  Full liquids Afebrile, BP down some No labs Intake/Output from previous day: 02/10 0701 - 02/11 0700 In: 4535 [P.O.:460; I.V.:4075] Out: 3150 [Urine:3150] Intake/Output this shift:    General appearance: alert, cooperative and no distress GI: soft, sore, taking liquids well, sites all look good  Lab Results:   Recent Labs  02/10/14 0040  WBC 22.0*  HGB 14.1  HCT 43.1  PLT 427*    BMET  Recent Labs  02/10/14 0040  NA 136  K 3.8  CL 100  CO2 23  GLUCOSE 159*  BUN 28*  CREATININE 1.01  CALCIUM 9.8   PT/INR No results for input(s): LABPROT, INR in the last 72 hours.   Recent Labs Lab 02/10/14 0040  AST 31  ALT 31  ALKPHOS 87  BILITOT 0.3  PROT 8.5*  ALBUMIN 4.7     Lipase     Component Value Date/Time   LIPASE 18 02/10/2014 0040     Studies/Results: Ct Abdomen Pelvis W Contrast  02/10/2014   CLINICAL DATA:  Acute onset of generalized abdominal pain. Initial encounter.  EXAM: CT ABDOMEN AND PELVIS WITH CONTRAST  TECHNIQUE: Multidetector CT imaging of the abdomen and pelvis was performed using the standard protocol following bolus administration of intravenous contrast.  CONTRAST:  OMNIPAQUE IOHEXOL 300 MG/ML  SOLN  COMPARISON:  None.  FINDINGS: The visualized lung bases are clear.  A 6 mm hypodensity is noted within the right hepatic lobe. The liver and spleen are otherwise unremarkable. The gallbladder is within normal limits. The pancreas and  adrenal glands are unremarkable.  Two small nonobstructing right renal stones are seen, measuring up to 3 mm in size, at the upper pole of the right kidney. There is no evidence of hydronephrosis. No obstructing ureteral stones are seen. No perinephric stranding is appreciated.  No free fluid is identified. The small bowel is unremarkable in appearance. The stomach is within normal limits. No acute vascular abnormalities are seen.  The appendix is mildly distended, measuring up to 8 mm, with a 1.1 cm appendicolith at the mid appendix. No significant soft tissue inflammation is seen, but this raises question for mild acute appendicitis. There is mildly increased appendiceal wall enhancement. The appendix is retrocecal and noted along the right pelvic sidewall, as the cecum is situated within the right hemipelvis.  The colon is partially filled with fluid, though the descending and sigmoid colon are decompressed. The colon is unremarkable.  The bladder is mildly distended, and somewhat compressed by the adjacent cecum. The bladder is grossly unremarkable. The uterus is unremarkable in appearance. The ovaries are relatively symmetric, with a small right-sided follicle seen. No suspicious adnexal masses are identified. No inguinal lymphadenopathy is seen.  No acute osseous abnormalities are identified.  IMPRESSION: 1. Mild distention of the appendix to 8 mm, with a 1.1 cm appendicolith at the mid appendix. No significant soft tissue inflammation is seen, but this raises  question for mild acute appendicitis. Mildly increased appendiceal wall enhancement noted. The appendix is retrocecal and seen along the right pelvic sidewall, as the cecum is situated within the right hemipelvis. 2. Two nonobstructing small right renal stones seen, measuring up to 3 mm in size. 3. 6 mm nonspecific hypodensity within the right hepatic lobe.  These results were called by telephone at the time of interpretation on 02/10/2014 at 5:38 am to  [facility], who verbally acknowledged these results.   Electronically Signed   By: Roanna RaiderJeffery  Chang M.D.   On: 02/10/2014 05:40    Medications:    Assessment/Plan Acute appendicitis S/p laparoscopic appendectomy 02/10/14     Plan:  As above, get her ready for discharge later this AM.      Sherrie GeorgeJENNINGS,Katelinn Justice 02/11/2014

## 2014-02-11 NOTE — Discharge Instructions (Signed)
Laparoscopic Appendectomy °Care After °Refer to this sheet in the next few weeks. These instructions provide you with information on caring for yourself after your procedure. Your caregiver may also give you more specific instructions. Your treatment has been planned according to current medical practices, but problems sometimes occur. Call your caregiver if you have any problems or questions after your procedure. °HOME CARE INSTRUCTIONS °· Do not drive while taking narcotic pain medicines. °· Use stool softener if you become constipated from your pain medicines. °· Change your bandages (dressings) as directed. °· Keep your wounds clean and dry. You may wash the wounds gently with soap and water. Gently pat the wounds dry with a clean towel. °· Do not take baths, swim, or use hot tubs for 10 days, or as instructed by your caregiver. °· Only take over-the-counter or prescription medicines for pain, discomfort, or fever as directed by your caregiver. °· You may continue your normal diet as directed. °· Do not lift more than 10 pounds (4.5 kg) or play contact sports for 3 weeks, or as directed. °· Slowly increase your activity after surgery. °· Take deep breaths to avoid getting a lung infection (pneumonia). °SEEK MEDICAL CARE IF: °· You have redness, swelling, or increasing pain in your wounds. °· You have pus coming from your wounds. °· You have drainage from a wound that lasts longer than 1 day. °· You notice a bad smell coming from the wounds or dressing. °· Your wound edges break open after stitches (sutures) have been removed. °· You notice increasing pain in the shoulders (shoulder strap areas) or near your shoulder blades. °· You develop dizzy episodes or fainting while standing. °· You develop shortness of breath. °· You develop persistent nausea or vomiting. °· You cannot control your bowel functions or lose your appetite. °· You develop diarrhea. °SEEK IMMEDIATE MEDICAL CARE IF:  °· You have a fever. °· You  develop a rash. °· You have difficulty breathing or sharp pains in your chest. °· You develop any reaction or side effects to medicines given. °MAKE SURE YOU: °· Understand these instructions. °· Will watch your condition. °· Will get help right away if you are not doing well or get worse. °Document Released: 12/18/2004 Document Revised: 03/12/2011 Document Reviewed: 06/27/2010 °ExitCare® Patient Information ©2015 [employer]. This information is not intended to replace advice given to you by your health care provider. Make sure you discuss any questions you have with your health care provider. °[facility] ______[facility] °LAPAROSCOPIC SURGERY: POST OP INSTRUCTIONS °Always review your discharge instruction sheet given to you by the facility where your surgery was performed. °IF YOU HAVE DISABILITY OR FAMILY LEAVE FORMS, YOU MUST BRING THEM TO THE OFFICE FOR PROCESSING.   °DO NOT GIVE THEM TO YOUR DOCTOR. ° °1. A prescription for pain medication may be given to you upon discharge.  Take your pain medication as prescribed, if needed.  If narcotic pain medicine is not needed, then you may take acetaminophen (Tylenol) or ibuprofen (Advil) as needed. °2. Take your usually prescribed medications unless otherwise directed. °3. If you need a refill on your pain medication, please contact your pharmacy.  They will contact our office to request authorization. Prescriptions will not be filled after 5pm or on week-ends. °4. You should follow a light diet the first few days after arrival home, such as soup and crackers, etc.  Be sure to include lots of fluids daily. °5. Most patients will experience some swelling and bruising   in the area of the incisions.  Ice packs will help.  Swelling and bruising can take several days to resolve.  °6. It is common to experience some constipation if taking pain medication after surgery.  Increasing fluid intake and taking a stool softener (such as Colace) will usually help or  prevent this problem from occurring.  A mild laxative (Milk of Magnesia or Miralax) should be taken according to package instructions if there are no bowel movements after 48 hours. °7. Unless discharge instructions indicate otherwise, you may remove your bandages 24-48 hours after surgery, and you may shower at that time.  You may have steri-strips (small skin tapes) in place directly over the incision.  These strips should be left on the skin for 7-10 days.  If your surgeon used skin glue on the incision, you may shower in 24 hours.  The glue will flake off over the next 2-3 weeks.  Any sutures or staples will be removed at the office during your follow-up visit. °8. ACTIVITIES:  You may resume regular (light) daily activities beginning the next day--such as daily self-care, walking, climbing stairs--gradually increasing activities as tolerated.  You may have sexual intercourse when it is comfortable.  Refrain from any heavy lifting or straining until approved by your doctor. °a. You may drive when you are no longer taking prescription pain medication, you can comfortably wear a seatbelt, and you can safely maneuver your car and apply brakes. °b. RETURN TO WORK:  __________________________________________________________ °9. You should see your doctor in the office for a follow-up appointment approximately 2-3 weeks after your surgery.  Make sure that you call for this appointment within a day or two after you arrive home to insure a convenient appointment time. °10. OTHER INSTRUCTIONS: __________________________________________________________________________________________________________________________ __________________________________________________________________________________________________________________________ °WHEN TO CALL YOUR DOCTOR: °1. Fever over 101.0 °2. Inability to urinate °3. Continued bleeding from incision. °4. Increased pain, redness, or drainage from the incision. °5. Increasing  abdominal pain ° °The clinic staff is available to answer your questions during regular business hours.  Please don’t hesitate to call and ask to speak to one of the nurses for clinical concerns.  If you have a medical emergency, go to the nearest emergency room or call 911.  A surgeon from [facility] is always on call at the hospital. °[address], [address], [place], [place]  [address] ? [address] [address], , [address]   [address] °[phone] ? [phone] ? FAX [phone] °Web site: [email or web address] ° °

## 2014-02-11 NOTE — Discharge Summary (Signed)
Physician Discharge Summary  Patient ID: Bonne Doloreslizabeth Wann MRN: [id number] DOB/AGE: [birth year] 28 y.o.  Admit date: 02/10/2014 Discharge date: 02/11/2014  Admission Diagnoses:  Acute appendicitis  Discharge Diagnoses:  Same  Active Problems:   Acute appendicitis   PROCEDURES: S/p laparoscopic appendectomy,  02/10/14, Dr. Carie CaddyPaul Toth  Hospital Course: [place] [age] y/o who felt overly full yesterday after lunch, but not bad. She came home, had supper and a glass of wine. Went to the bath room and developed diarrhea, nausea and vomiting. This kept happening all evening about every 10 min. Her husband finally made her come to the ED. She says she never had any real abdominal pain, she just felt bloated and uncomfortable, like she had eaten to much.  Work up in the ED shows she is afebrile, BP 99/54. Labs show WBC 22K, left shift. Glucose up to 159 and bun up to 28. UA negative/pregnanucy test negative. CT shows: The appendix is mildly distended, measuring up to 8 mm, with a 1.1 cm appendicolith at the mid appendix. No significant soft tissue inflammation is seen, but this raises question for mild acute appendicitis. There is mildly increased appendiceal wall enhancement. The appendix is retrocecal and noted along the right pelvic sidewall, as the cecum is situated within the right hemipelvis. Two nonobstructing small right renal stones seen, measuring up to 3 mm in size. A 6 mm nonspecific hypodensity within the right hepatic lobe. WE are ask to see. She still does not really have much pain, just some distended like feeling, ? Minimal discomfort RLQ. She was admitted and taken to the OP later that AM.  She has done well post op and is ready for discharge home.  Condition on d/c:  Improved.  Disposition: 01-Home or Self Care     Medication List    TAKE these medications        calcium carbonate 500 MG chewable tablet  Commonly known as:  TUMS - dosed in mg elemental calcium  Chew 2  tablets by mouth daily as needed for indigestion or heartburn.     etonogestrel-ethinyl estradiol 0.12-0.015 MG/24HR vaginal ring  Commonly known as:  NUVARING  Place 1 each vaginally every 28 (twenty-eight) days. Insert vaginally and leave in place for 3 consecutive weeks, then remove for 1 week.     ibuprofen 200 MG tablet  Commonly known as:  ADVIL,MOTRIN  Take 400-600 mg by mouth every 6 (six) hours as needed (for pain).     oxyCODONE-acetaminophen 5-325 MG per tablet  Commonly known as:  PERCOCET/ROXICET  Take 1-2 tablets by mouth every 4 (four) hours as needed for moderate pain.     prenatal multivitamin Tabs tablet  Take 1 tablet by mouth daily at 12 noon.           Follow-up Information    Follow up with [facility] [place] On 03/02/2014.   Why:  You are scheduled to be seen in the [facility] clinic.  Your appointment is at 3:45, be there 30 minutes early for check in.   Contact information:   [address] [place] [id number] [phone]      Signed: Sherrie GeorgeJENNINGS,Aaric Dolph 02/11/2014, 1:31 PM

## 2014-02-11 NOTE — Plan of Care (Signed)
Problem: Phase I Progression Outcomes Goal: OOB as tolerated unless otherwise ordered Outcome: Progressing Ambulated in hallway and in room with minimal assist

## 2014-05-26 LAB — OB RESULTS CONSOLE HIV ANTIBODY (ROUTINE TESTING): HIV: NONREACTIVE

## 2014-05-26 LAB — OB RESULTS CONSOLE ANTIBODY SCREEN: ANTIBODY SCREEN: NEGATIVE

## 2014-05-26 LAB — OB RESULTS CONSOLE RUBELLA ANTIBODY, IGM: Rubella: IMMUNE

## 2014-05-26 LAB — OB RESULTS CONSOLE HEPATITIS B SURFACE ANTIGEN: HEP B S AG: NEGATIVE

## 2014-05-26 LAB — OB RESULTS CONSOLE RPR: RPR: NONREACTIVE

## 2014-05-26 LAB — OB RESULTS CONSOLE ABO/RH: RH Type: POSITIVE

## 2014-06-02 LAB — OB RESULTS CONSOLE GC/CHLAMYDIA
CHLAMYDIA, DNA PROBE: NEGATIVE
GC PROBE AMP, GENITAL: NEGATIVE

## 2014-06-22 IMAGING — CR DG FOOT COMPLETE 3+V*L*
3 series · 3 of 3 positions shown · non-contrast
Comparison: None.

CLINICAL DATA: Per pt: walking on concrete and left foot was a
sleep, kept walking and heard pop. Pain pointed to lateral aspect
of  left foot. No prior injury to left foot

LEFT FOOT - COMPLETE 3+ VIEW

[view not recorded (1 of 3)]
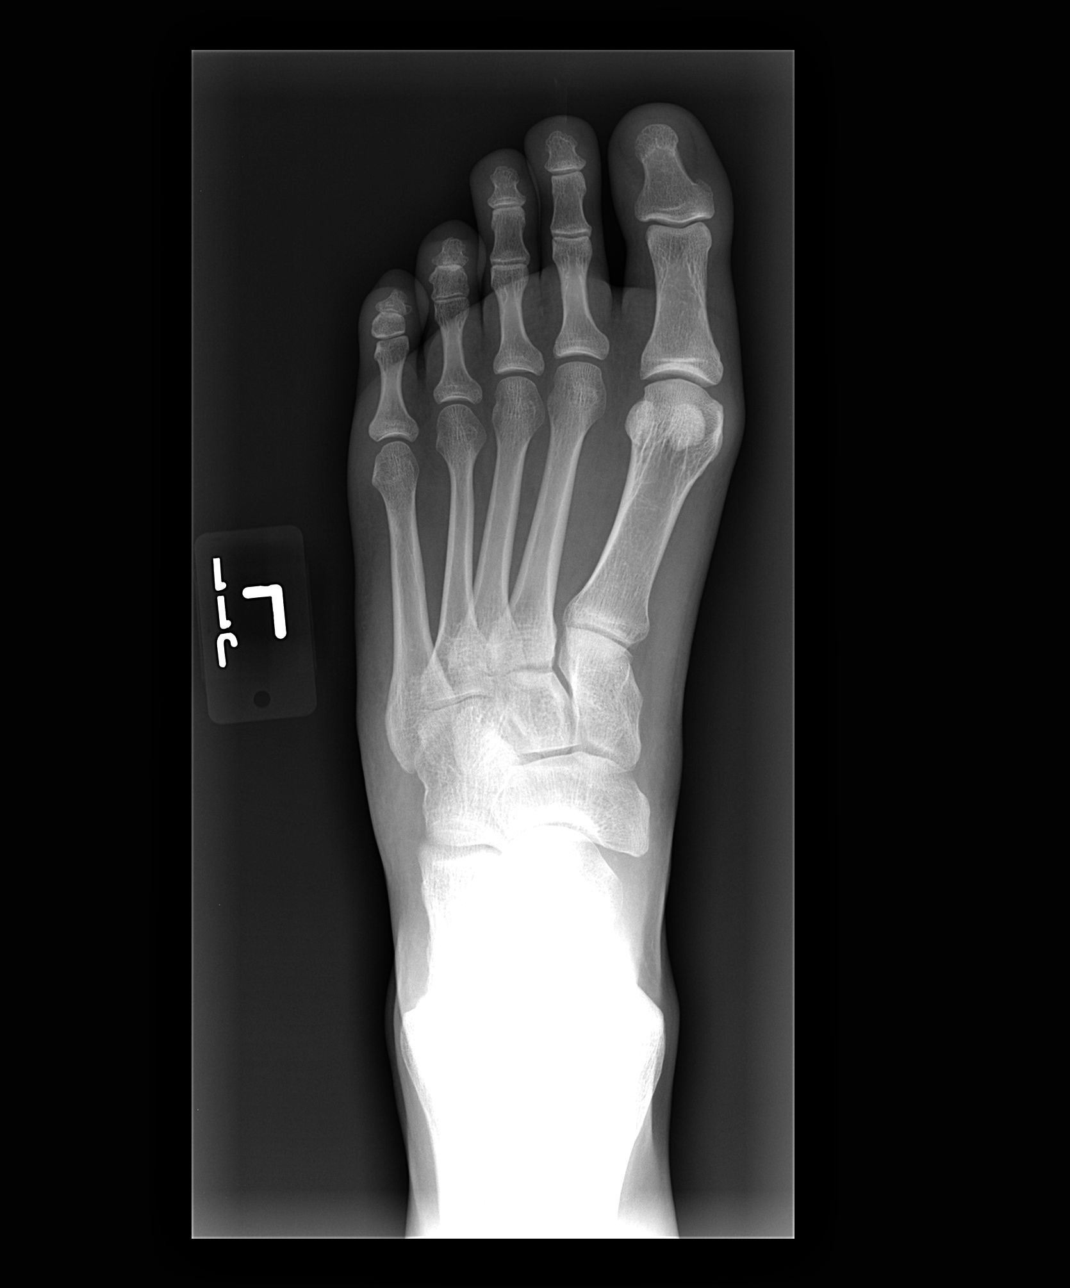

[view not recorded (2 of 3)]
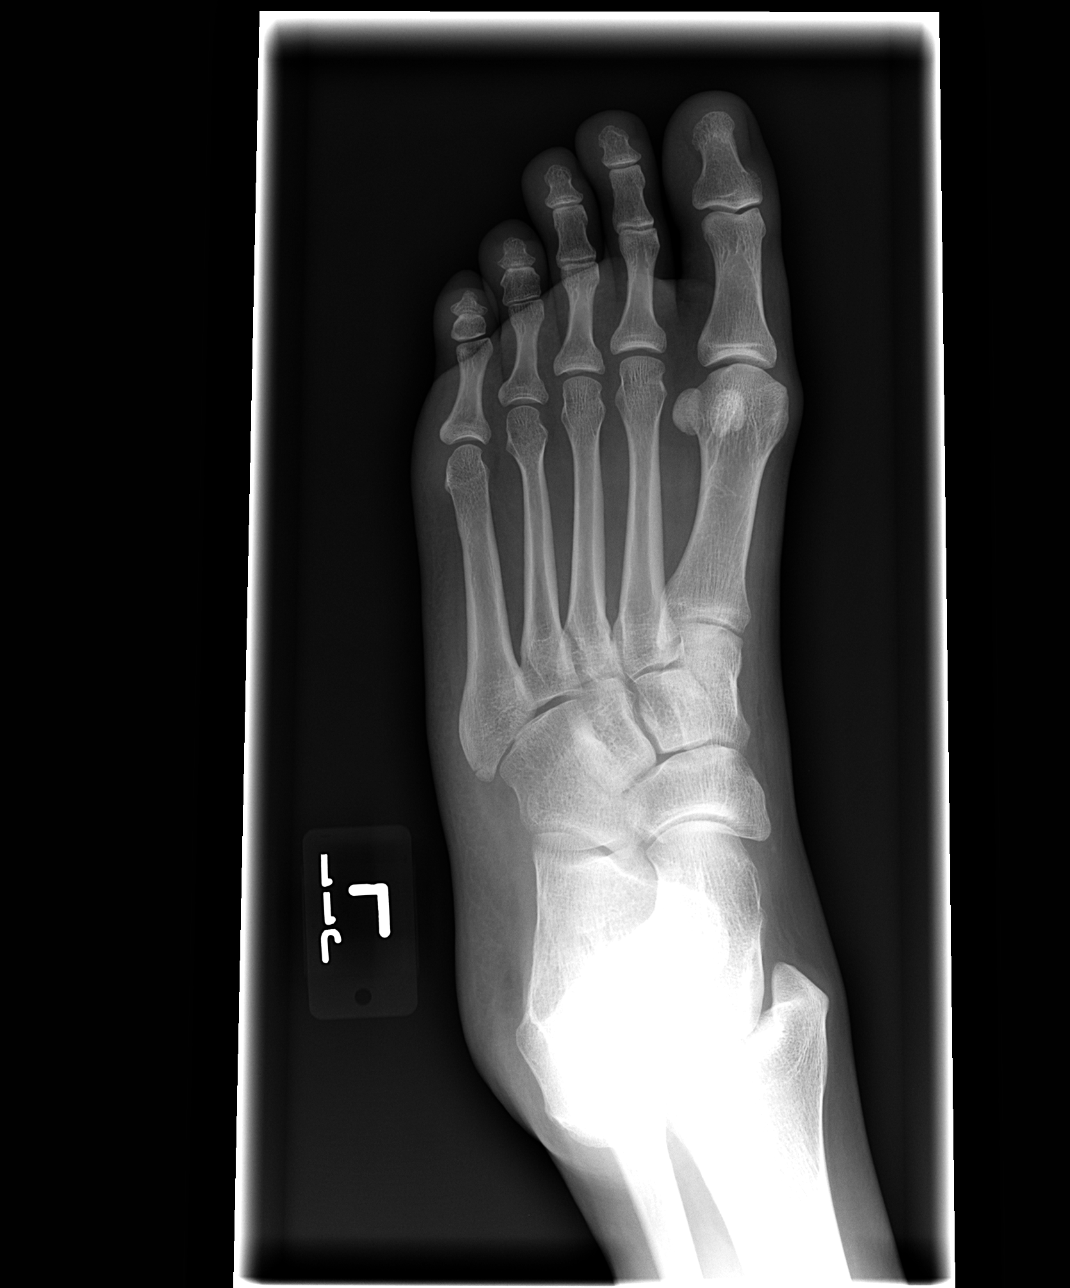

[view not recorded (3 of 3)]
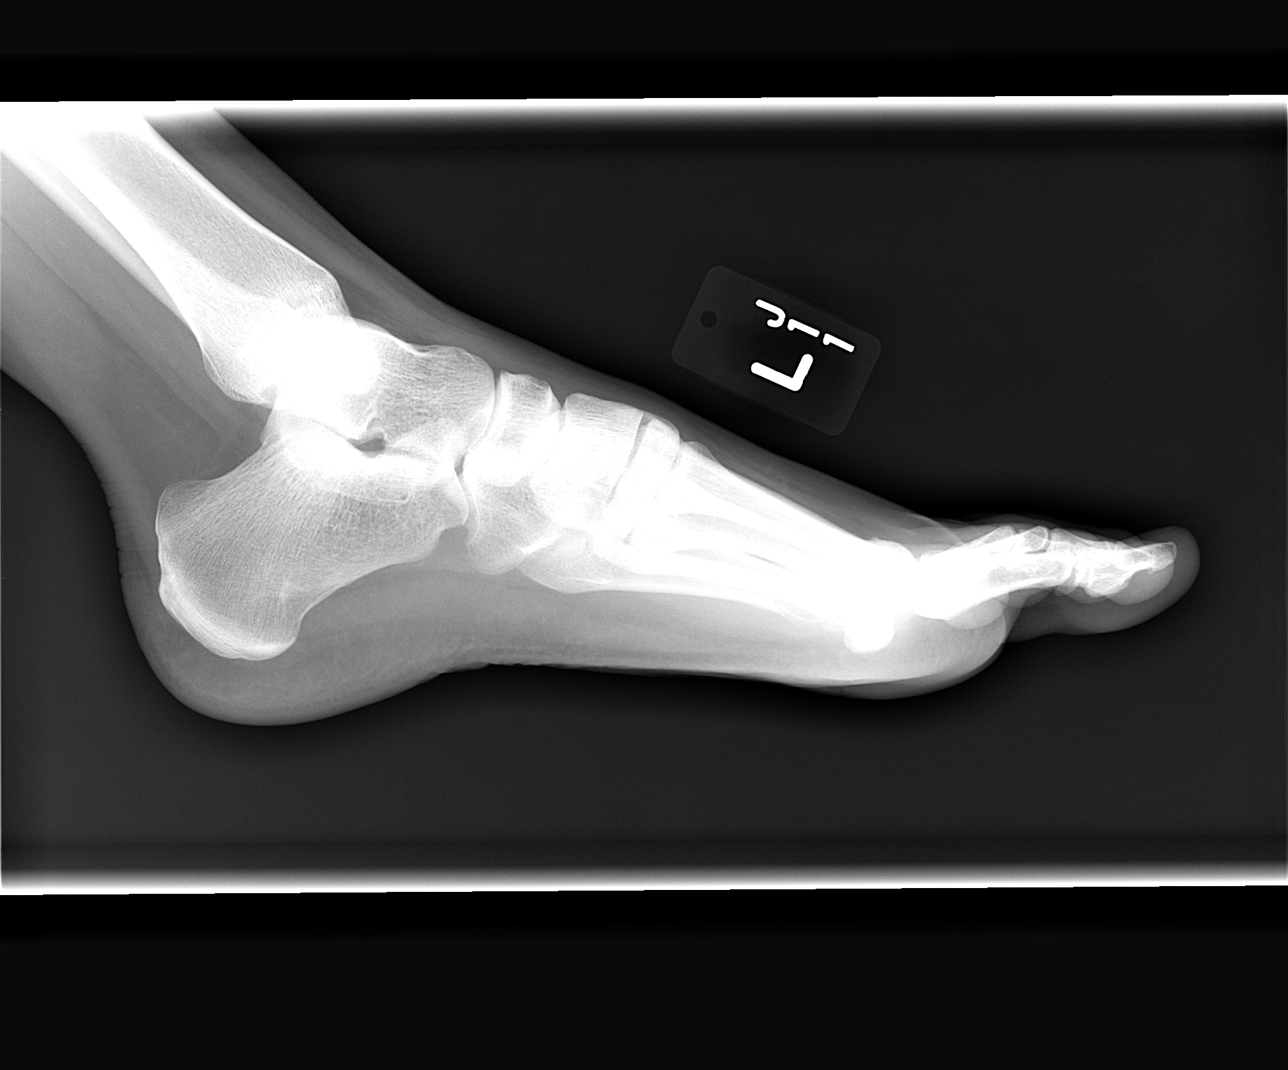

[3 of 3 positions shown; findings below may reference images not displayed]

FINDINGS: Lisfranc joint alignment normal.  Subtle transversely
oriented lucency along the proximal tip of the base of the fifth
metatarsal may represent nondisplaced avulsion fracture - correlate
with point tenderness in this vicinity.

No additional significant abnormality observed.
IMPRESSION: 1.  Subtle linear lucency at the proximal tip of the base of the
fifth metatarsal, favoring a small nondisplaced avulsion fracture
or over an unfused ossification center.  Correlate with point
tenderness at this site.

## 2014-12-08 LAB — OB RESULTS CONSOLE GBS: GBS: POSITIVE

## 2014-12-25 ENCOUNTER — Encounter (HOSPITAL_COMMUNITY): Payer: Self-pay | Admitting: *Deleted

## 2014-12-25 ENCOUNTER — Inpatient Hospital Stay (HOSPITAL_COMMUNITY)
Admission: AD | Admit: 2014-12-25 | Discharge: 2014-12-28 | DRG: 775 | Disposition: A | Discharge status: Home / Self Care | Payer: Managed Care, Other (non HMO) | Source: Ambulatory Visit | Attending: Obstetrics & Gynecology | Admitting: Obstetrics & Gynecology

## 2014-12-25 DIAGNOSIS — Z803 Family history of malignant neoplasm of breast: Secondary | ICD-10-CM

## 2014-12-25 DIAGNOSIS — O9902 Anemia complicating childbirth: Secondary | ICD-10-CM | POA: Diagnosis present

## 2014-12-25 DIAGNOSIS — D649 Anemia, unspecified: Secondary | ICD-10-CM | POA: Diagnosis present

## 2014-12-25 DIAGNOSIS — O99824 Streptococcus B carrier state complicating childbirth: Secondary | ICD-10-CM | POA: Diagnosis present

## 2014-12-25 DIAGNOSIS — Z8262 Family history of osteoporosis: Secondary | ICD-10-CM | POA: Diagnosis not present

## 2014-12-25 DIAGNOSIS — Z3A38 38 weeks gestation of pregnancy: Secondary | ICD-10-CM | POA: Diagnosis not present

## 2014-12-25 DIAGNOSIS — O4290 Premature rupture of membranes, unspecified as to length of time between rupture and onset of labor, unspecified weeks of gestation: Secondary | ICD-10-CM | POA: Diagnosis present

## 2014-12-25 DIAGNOSIS — O99019 Anemia complicating pregnancy, unspecified trimester: Secondary | ICD-10-CM | POA: Diagnosis present

## 2014-12-25 LAB — CBC
HCT: 32.5 % — ABNORMAL LOW (ref 36.0–46.0)
Hemoglobin: 10.8 g/dL — ABNORMAL LOW (ref 12.0–15.0)
MCH: 26.2 pg (ref 26.0–34.0)
MCHC: 33.2 g/dL (ref 30.0–36.0)
MCV: 78.7 fL (ref 78.0–100.0)
PLATELETS: 381 10*3/uL (ref 150–400)
RBC: 4.13 MIL/uL (ref 3.87–5.11)
RDW: 14.7 % (ref 11.5–15.5)
WBC: 9.6 10*3/uL (ref 4.0–10.5)

## 2014-12-25 MED ORDER — FLEET ENEMA 7-19 GM/118ML RE ENEM: 1.0000 | ENEMA | Freq: Every day | RECTAL | Status: DC | PRN

## 2014-12-25 MED ORDER — TERBUTALINE SULFATE 1 MG/ML IJ SOLN
0.2500 mg | Freq: Once | INTRAMUSCULAR | Status: DC | PRN
  Filled 2014-12-25: qty 1

## 2014-12-25 MED ORDER — OXYTOCIN 40 UNITS IN LACTATED RINGERS INFUSION - SIMPLE MED: 1.0000 m[IU]/min | INTRAVENOUS | Status: DC

## 2014-12-25 MED ORDER — OXYCODONE-ACETAMINOPHEN 5-325 MG PO TABS: 2.0000 | ORAL_TABLET | ORAL | Status: DC | PRN

## 2014-12-25 MED ORDER — CITRIC ACID-SODIUM CITRATE 334-500 MG/5ML PO SOLN: 30.0000 mL | ORAL | Status: DC | PRN

## 2014-12-25 MED ORDER — LACTATED RINGERS IV SOLN
500.0000 mL | INTRAVENOUS | Status: DC | PRN
  Administered 2014-12-26: 500 mL via INTRAVENOUS
  Administered 2014-12-26: 1000 mL via INTRAVENOUS
  Administered 2014-12-26 (×2): 500 mL via INTRAVENOUS

## 2014-12-25 MED ORDER — OXYTOCIN BOLUS FROM INFUSION: 500.0000 mL | INTRAVENOUS | Status: DC

## 2014-12-25 MED ORDER — OXYCODONE-ACETAMINOPHEN 5-325 MG PO TABS: 1.0000 | ORAL_TABLET | ORAL | Status: DC | PRN

## 2014-12-25 MED ORDER — PENICILLIN G POTASSIUM 5000000 UNITS IJ SOLR
5.0000 10*6.[IU] | Freq: Once | INTRAVENOUS | Status: AC
  Administered 2014-12-25: 5 10*6.[IU] via INTRAVENOUS
  Filled 2014-12-25: qty 5

## 2014-12-25 MED ORDER — OXYTOCIN 40 UNITS IN LACTATED RINGERS INFUSION - SIMPLE MED
62.5000 mL/h | INTRAVENOUS | Status: DC
  Administered 2014-12-26: 62.5 mL/h via INTRAVENOUS

## 2014-12-25 MED ORDER — ONDANSETRON HCL 4 MG/2ML IJ SOLN
4.0000 mg | Freq: Four times a day (QID) | INTRAMUSCULAR | Status: DC | PRN
  Administered 2014-12-26: 4 mg via INTRAVENOUS
  Filled 2014-12-25: qty 2

## 2014-12-25 MED ORDER — LACTATED RINGERS IV SOLN
INTRAVENOUS | Status: DC
  Administered 2014-12-25 – 2014-12-26 (×5): via INTRAVENOUS

## 2014-12-25 MED ORDER — LIDOCAINE HCL (PF) 1 % IJ SOLN
30.0000 mL | INTRAMUSCULAR | Status: DC | PRN
  Filled 2014-12-25: qty 30

## 2014-12-25 MED ORDER — ACETAMINOPHEN 325 MG PO TABS: 650.0000 mg | ORAL_TABLET | ORAL | Status: DC | PRN

## 2014-12-25 MED ORDER — PENICILLIN G POTASSIUM 5000000 UNITS IJ SOLR
2.5000 10*6.[IU] | INTRAVENOUS | Status: DC
  Administered 2014-12-26 (×4): 2.5 10*6.[IU] via INTRAVENOUS
  Filled 2014-12-25 (×10): qty 2.5

## 2014-12-25 NOTE — Progress Notes (Signed)
Large amt pink tinged fld noted on pad in Triage. New pad given

## 2014-12-25 NOTE — MAU Note (Signed)
SROM about 2100. Clear fld. 1cm last sve. Good FM all day but has not felt movement since.

## 2014-12-25 NOTE — Progress Notes (Signed)
Pt to BS from Triage for futher eval of SROM

## 2014-12-26 ENCOUNTER — Inpatient Hospital Stay (HOSPITAL_COMMUNITY): Payer: Managed Care, Other (non HMO) | Admitting: Anesthesiology

## 2014-12-26 ENCOUNTER — Encounter (HOSPITAL_COMMUNITY): Payer: Self-pay

## 2014-12-26 LAB — RPR: RPR: NONREACTIVE

## 2014-12-26 LAB — TYPE AND SCREEN
ABO/RH(D): O POS
Antibody Screen: NEGATIVE

## 2014-12-26 LAB — ABO/RH: ABO/RH(D): O POS

## 2014-12-26 MED ORDER — OXYTOCIN 40 UNITS IN LACTATED RINGERS INFUSION - SIMPLE MED: 1.0000 m[IU]/min | INTRAVENOUS | Status: DC

## 2014-12-26 MED ORDER — SENNOSIDES-DOCUSATE SODIUM 8.6-50 MG PO TABS
2.0000 | ORAL_TABLET | ORAL | Status: DC
  Administered 2014-12-27 (×2): 2 via ORAL
  Filled 2014-12-26 (×2): qty 2

## 2014-12-26 MED ORDER — DIPHENHYDRAMINE HCL 25 MG PO CAPS: 25.0000 mg | ORAL_CAPSULE | Freq: Four times a day (QID) | ORAL | Status: DC | PRN

## 2014-12-26 MED ORDER — NALBUPHINE HCL 10 MG/ML IJ SOLN
5.0000 mg | Freq: Once | INTRAMUSCULAR | Status: AC
  Administered 2014-12-26: 5 mg via INTRAVENOUS
  Filled 2014-12-26: qty 1

## 2014-12-26 MED ORDER — PHENYLEPHRINE 40 MCG/ML (10ML) SYRINGE FOR IV PUSH (FOR BLOOD PRESSURE SUPPORT)
80.0000 ug | PREFILLED_SYRINGE | INTRAVENOUS | Status: AC | PRN
  Administered 2014-12-26 (×3): 80 ug via INTRAVENOUS
  Filled 2014-12-26: qty 20

## 2014-12-26 MED ORDER — PRENATAL MULTIVITAMIN CH
1.0000 | ORAL_TABLET | Freq: Every day | ORAL | Status: DC
  Administered 2014-12-27 – 2014-12-28 (×2): 1 via ORAL
  Filled 2014-12-26 (×2): qty 1

## 2014-12-26 MED ORDER — LIDOCAINE-EPINEPHRINE (PF) 2 %-1:200000 IJ SOLN
INTRAMUSCULAR | Status: DC | PRN
  Administered 2014-12-26: 3 mL

## 2014-12-26 MED ORDER — ZOLPIDEM TARTRATE 5 MG PO TABS: 5.0000 mg | ORAL_TABLET | Freq: Every evening | ORAL | Status: DC | PRN

## 2014-12-26 MED ORDER — EPHEDRINE 5 MG/ML INJ
10.0000 mg | INTRAVENOUS | Status: DC | PRN
Start: 2014-12-26 — End: 2014-12-26
  Administered 2014-12-26: 10 mg via INTRAVENOUS
  Filled 2014-12-26: qty 2
  Filled 2014-12-26: qty 4

## 2014-12-26 MED ORDER — NALBUPHINE HCL 10 MG/ML IJ SOLN
5.0000 mg | Freq: Once | INTRAMUSCULAR | Status: AC
  Administered 2014-12-26: 5 mg via SUBCUTANEOUS
  Filled 2014-12-26: qty 1

## 2014-12-26 MED ORDER — ONDANSETRON HCL 4 MG/2ML IJ SOLN
4.0000 mg | INTRAMUSCULAR | Status: DC | PRN
Start: 2014-12-26 — End: 2014-12-27

## 2014-12-26 MED ORDER — DIPHENHYDRAMINE HCL 50 MG/ML IJ SOLN: 12.5000 mg | INTRAMUSCULAR | Status: DC | PRN

## 2014-12-26 MED ORDER — PHENYLEPHRINE 40 MCG/ML (10ML) SYRINGE FOR IV PUSH (FOR BLOOD PRESSURE SUPPORT)
80.0000 ug | PREFILLED_SYRINGE | Freq: Once | INTRAVENOUS | Status: AC
  Administered 2014-12-26: 80 ug via INTRAVENOUS

## 2014-12-26 MED ORDER — SIMETHICONE 80 MG PO CHEW: 80.0000 mg | CHEWABLE_TABLET | ORAL | Status: DC | PRN

## 2014-12-26 MED ORDER — OXYCODONE-ACETAMINOPHEN 5-325 MG PO TABS
1.0000 | ORAL_TABLET | ORAL | Status: DC | PRN
  Administered 2014-12-27 (×3): 1 via ORAL
  Filled 2014-12-26 (×3): qty 1

## 2014-12-26 MED ORDER — LANOLIN HYDROUS EX OINT: TOPICAL_OINTMENT | CUTANEOUS | Status: DC | PRN

## 2014-12-26 MED ORDER — DIBUCAINE 1 % RE OINT: 1.0000 "application " | TOPICAL_OINTMENT | RECTAL | Status: DC | PRN

## 2014-12-26 MED ORDER — IBUPROFEN 600 MG PO TABS
600.0000 mg | ORAL_TABLET | Freq: Four times a day (QID) | ORAL | Status: DC
  Administered 2014-12-26 – 2014-12-28 (×7): 600 mg via ORAL
  Filled 2014-12-26 (×7): qty 1

## 2014-12-26 MED ORDER — FENTANYL 2.5 MCG/ML BUPIVACAINE 1/10 % EPIDURAL INFUSION (WH - ANES)
14.0000 mL/h | INTRAMUSCULAR | Status: DC | PRN
  Administered 2014-12-26 (×2): 14 mL/h via EPIDURAL
  Filled 2014-12-26 (×2): qty 125

## 2014-12-26 MED ORDER — OXYTOCIN 40 UNITS IN LACTATED RINGERS INFUSION - SIMPLE MED
1.0000 m[IU]/min | INTRAVENOUS | Status: DC
  Administered 2014-12-26: 1 m[IU]/min via INTRAVENOUS
  Filled 2014-12-26: qty 1000

## 2014-12-26 MED ORDER — OXYCODONE-ACETAMINOPHEN 5-325 MG PO TABS
2.0000 | ORAL_TABLET | ORAL | Status: DC | PRN
  Administered 2014-12-28: 2 via ORAL
  Filled 2014-12-26: qty 2

## 2014-12-26 MED ORDER — ACETAMINOPHEN 325 MG PO TABS: 650.0000 mg | ORAL_TABLET | ORAL | Status: DC | PRN

## 2014-12-26 MED ORDER — PHENYLEPHRINE 200 MCG/ML (NON-ED) FOR PRIAPISM: 80.0000 ug | Freq: Once | INTRAMUSCULAR | Status: DC

## 2014-12-26 MED ORDER — ONDANSETRON HCL 4 MG PO TABS: 4.0000 mg | ORAL_TABLET | ORAL | Status: DC | PRN

## 2014-12-26 MED ORDER — TETANUS-DIPHTH-ACELL PERTUSSIS 5-2.5-18.5 LF-MCG/0.5 IM SUSP: 0.5000 mL | Freq: Once | INTRAMUSCULAR | Status: DC

## 2014-12-26 MED ORDER — BUPIVACAINE HCL (PF) 0.25 % IJ SOLN
INTRAMUSCULAR | Status: DC | PRN
  Administered 2014-12-26 (×2): 4 mL via EPIDURAL

## 2014-12-26 MED ORDER — WITCH HAZEL-GLYCERIN EX PADS
1.0000 | MEDICATED_PAD | CUTANEOUS | Status: DC | PRN
Start: 2014-12-26 — End: 2014-12-28

## 2014-12-26 MED ORDER — BENZOCAINE-MENTHOL 20-0.5 % EX AERO
1.0000 "application " | INHALATION_SPRAY | CUTANEOUS | Status: DC | PRN
  Administered 2014-12-27: 1 via TOPICAL
  Filled 2014-12-26: qty 56

## 2014-12-26 NOTE — Anesthesia Preprocedure Evaluation (Signed)
Anesthesia Evaluation  Patient identified by MRN, date of birth, ID band Patient awake    Reviewed: Allergy & Precautions, Patient's Chart, lab work & pertinent test results  History of Anesthesia Complications Negative for: history of anesthetic complications  Airway Mallampati: II  TM Distance: >3 FB Neck ROM: Full    Dental  (+) Teeth Intact   Pulmonary neg pulmonary ROS,    breath sounds clear to auscultation       Cardiovascular negative cardio ROS   Rhythm:Regular     Neuro/Psych negative neurological ROS  negative psych ROS   GI/Hepatic negative GI ROS, Neg liver ROS,   Endo/Other  negative endocrine ROS  Renal/GU negative Renal ROS     Musculoskeletal   Abdominal   Peds  Hematology negative hematology ROS (+) anemia ,   Anesthesia Other Findings   Reproductive/Obstetrics (+) Pregnancy                             Anesthesia Physical Anesthesia Plan  ASA: II  Anesthesia Plan: Epidural   Post-op Pain Management:    Induction:   Airway Management Planned:   Additional Equipment:   Intra-op Plan:   Post-operative Plan:   Informed Consent: I have reviewed the patients History and Physical, chart, labs and discussed the procedure including the risks, benefits and alternatives for the proposed anesthesia with the patient or authorized representative who has indicated his/her understanding and acceptance.   Dental advisory given  Plan Discussed with: Anesthesiologist  Anesthesia Plan Comments:         Anesthesia Quick Evaluation

## 2014-12-26 NOTE — Anesthesia Procedure Notes (Signed)
Epidural Patient location during procedure: OB  Staffing Anesthesiologist: Reyaan Thoma Performed by: anesthesiologist   Preanesthetic Checklist Completed: patient identified, surgical consent, pre-op evaluation, timeout performed, IV checked, risks and benefits discussed and monitors and equipment checked  Epidural Patient position: sitting Prep: DuraPrep Patient monitoring: heart rate, cardiac monitor, continuous pulse ox and blood pressure Approach: midline Location: L3-L4 Injection technique: LOR saline  Needle:  Needle type: Tuohy  Needle gauge: 17 G Needle length: 9 cm Needle insertion depth: 7 cm Catheter type: closed end flexible Catheter size: 19 Gauge Catheter at skin depth: 13 cm Test dose: negative and 2% lidocaine with Epi 1:200 K  Assessment Events: blood not aspirated, injection not painful, no injection resistance, negative IV test and no paresthesia  Additional Notes Reason for block:procedure for pain   

## 2014-12-26 NOTE — Progress Notes (Signed)
Subjective: Doing well, pain controled with epidural, UCs q3-4 min  Anesthesia epidural Pitocin augmentation Pen G for GBS pos   Objective: BP 98/63 mmHg  Pulse 89  Temp(Src) 98.3 F (36.8 C) (Oral)  Resp 18  Ht 5\' 2"  (1.575 m)  Wt 174 lb (78.926 kg)  BMI 31.82 kg/m2  SpO2 98%  LMP 01/26/2014   FHT:  FHR: 120's bpm, variability: moderate,  accelerations:  Present,  decelerations:  Present Mild, occasionally moderate, variable decelerations with UCs  UC:   regular, every 3-4 minutes VE:   Dilation: 4.5 Effacement (%): 90 Station: -1 Exam by:: Seymour BarsLavoie, MD IUPC put in place easily  Assessment / Plan: Augmentation of labor with slow progression in active phase.  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  IUPC put in place.  Will increase Pitocin to obtain adequate MVU.  SROM x 18 hrs.  No sign of Chorio.  On Pen G for GBS.  Fetal Wellbeing:  Category I Pain Control:  Epidural  Anticipated MOD:  NSVD.  Risk of C/S for non-reassuring FHR or arrest of progression discussed.  Nancy Wells,Nancy Wells 12/26/2014, 3:01 PM

## 2014-12-26 NOTE — Progress Notes (Signed)
Subjective: Doing well, pain 8-9/10, UCs q34 min  Anesthesia requesting epidural.  Had Nubain x 1   Objective: BP 105/64 mmHg  Pulse 83  Temp(Src) 98.1 F (36.7 C) (Oral)  Resp 18  Ht 5\' 2"  (1.575 m)  Wt 174 lb (78.926 kg)  BMI 31.82 kg/m2  LMP 01/26/2014   FHT:  FHR: 120's bpm, variability: moderate,  accelerations:  Present,  decelerations:  Absent UC:   regular, every 3-4 minutes VE:   Dilation: 3.5 Effacement (%): 90 Station: -1 Exam by:: Seymour BarsLavoie, MD  FHR reactive to scalp stimulation with good accelerations   Assessment / Plan: Augmentation of labor, progressing well.  GBS covered.  Fetal Wellbeing:  Category I Pain Control:  Epidural ordered  Anticipated MOD:  NSVD  Jaythan Hinely,MARIE-LYNE 12/26/2014, 9:20 AM

## 2014-12-26 NOTE — H&P (Signed)
Nancy Wells is a 29 y.o. female G1P0000 5634w5d presenting for SROM.  HPP/HPI:  Clear AF leak x 9 pm 12/24th.  Fern pos.  Irregular mild UCs at presentation.  Good FMs.  No vaginal bleeding.  Normal pregnancy with GBS pos.  No PEC Sx.  OB History    Gravida Para Term Preterm AB TAB SAB Ectopic Multiple Living   1 0 0 0 0 0 0 0 0 0      Past Medical History  Diagnosis Date  . Anemia   . Migraines     no aura  . Pyelonephritis 2010  . Trigeminal neuralgia    Past Surgical History  Procedure Laterality Date  . Wisdom tooth extraction    . Laparoscopic appendectomy N/A 02/10/2014    Procedure: APPENDECTOMY LAPAROSCOPIC;  Surgeon: Chevis PrettyPaul Toth III, MD;  Location: [facility];  Service: General;  Laterality: N/A;   Family History: family history includes Breast cancer in her paternal grandmother; Cancer in her father; Osteoporosis in her mother. Social History:  reports that she has never smoked. She does not have any smokeless tobacco history on file. She reports that she drinks about 2.0 oz of alcohol per week. She reports that she does not use illicit drugs.  Allergies  Allergen Reactions  . Tegretol [Carbamazepine]     Unable to walk/ unable to see    Dilation: 2.5 Effacement (%): 60, 70 Station: -3 Exam by:: Otelia SanteeBriea Parks RN   Blood pressure 98/66, pulse 93, temperature 97.9 F (36.6 C), temperature source Oral, resp. rate 18, height 5\' 2"  (1.575 m), weight 174 lb (78.926 kg), last menstrual period 01/26/2014. Exam Physical Exam   [facility] Base line 120's with good variability and accelerations before Nubain.  Post Nubain, FHR 115-120's with good variability but rare accelerations.  No deceleration. UCs q3 min ++  HPP:  Patient Active Problem List   Diagnosis Date Noted  . Premature rupture of membranes 12/25/2014  . Acute appendicitis 02/10/2014    Prenatal labs: ABO, Rh: --/--/O POS, O POS (12/24 2230) Antibody: NEG (12/24 2230) Rubella: Immune RPR: Nonreactive  (05/25 0000)  HBsAg: Negative (05/25 0000)  HIV: Non-reactive (05/25 0000)  Genetic testing: Ultrascreen wnl.  AFP1 neg US anato: wnl 1 hr GTT: wnl GBS: Positive (12/07 0000)   Assessment/Plan: 38 5/7 wks with SROM and Spontaneous Labor.  Pitocin Augmentation.  GBS pos on Pen G.  FHR monitoring Cat 1.  Expectant management towards probable vaginal delivery.  Epidural PRN.   Rasaan Brotherton,MARIE-LYNE 12/26/2014, 8:40 AM

## 2014-12-27 LAB — CBC
HCT: 31.2 % — ABNORMAL LOW (ref 36.0–46.0)
Hemoglobin: 10 g/dL — ABNORMAL LOW (ref 12.0–15.0)
MCH: 25.9 pg — ABNORMAL LOW (ref 26.0–34.0)
MCHC: 32.1 g/dL (ref 30.0–36.0)
MCV: 80.8 fL (ref 78.0–100.0)
PLATELETS: 321 10*3/uL (ref 150–400)
RBC: 3.86 MIL/uL — AB (ref 3.87–5.11)
RDW: 14.9 % (ref 11.5–15.5)
WBC: 9.6 10*3/uL (ref 4.0–10.5)

## 2014-12-27 NOTE — Anesthesia Postprocedure Evaluation (Signed)
Anesthesia Post Note  Patient: Nancy Wells  Procedure(s) Performed: * No procedures listed *  Patient location during evaluation: Mother Baby Anesthesia Type: Epidural Level of consciousness: awake and alert and oriented Pain management: pain level controlled Vital Signs Assessment: post-procedure vital signs reviewed and stable Respiratory status: spontaneous breathing, nonlabored ventilation and respiratory function stable Cardiovascular status: stable Postop Assessment: no headache, no backache, patient able to bend at knees, no signs of nausea or vomiting and adequate PO intake Anesthetic complications: no    Last Vitals:  Filed Vitals:   12/27/14 0012 12/27/14 0423  BP: 90/56 85/51  Pulse: 90 73  Temp: 36.7 C   Resp: 16     Last Pain:  Filed Vitals:   12/27/14 0654  PainSc: 2                  Jaimya Feliciano,Veralyn

## 2014-12-27 NOTE — Progress Notes (Signed)
Patient ID: Nancy Wells, female   DOB: 03/15/1985, 29 y.o.   MRN: [id number] Infant circumcision informed consent obtained, baby needs evaluation this morning by Peds and needs to void before we can proceed. No questions per patient.  Hilary HertzV,Kieu Quiggle, MD

## 2014-12-27 NOTE — Lactation Note (Signed)
This note was copied from the chart of Nancy Wells. Lactation Consultation Note  Patient Name: Nancy Wells GMWNU'UToday's Date: 12/27/2014   Initial consult with mom of 14 hour old infant. Mom, Dad and infant asleep. Left LC Brochures at bedside, will return at a later time.      Maternal Data    Feeding    LATCH Score/Interventions                      Lactation Tools Discussed/Used     Consult Status      Ed BlalockSharon S Emmelia Holdsworth 12/27/2014, 10:35 AM

## 2014-12-27 NOTE — Lactation Note (Signed)
This note was copied from the chart of Boy Bonne Doloreslizabeth Borner. Lactation Consultation Note  Patient Name: Boy Bonne Doloreslizabeth Muratore RUEAV'WToday's Date: 12/27/2014 Reason for consult: Initial assessment   Initial assessment with first time mom of 17 hour old infant. Infant with 1 BF for 10 minutes, 4 attempts, 2 stools, 1 emesis and 0 voids since birth. Infant weight 7 lb 1.2 oz. Mom reports infant will grasp nipple, suck a few times, and then pull off. Mom was latching infant to left breast using cradle hold. Infant rooting and attempting to get nipple into mouth. Assisted mom in positioning infant in the cross cradle hold, infant latched easily and began rhythmic suckling with intermittent swallows. Infant noted to have flanged lips. Mom with soft compressible breasts and everted nipples. Mom is able to hand express gtts colostrum. Mom massaging/compressing breasts with feeding. Reviewed BF basics, positioning, awakening techniques stomach size, cluster feeds, NB Nutritional needs, Colostrum, milk coming to volume, I/O, Feeding log, and nipple care. Infant was still nursing when I left the room. [facility] given to [facility], advised of Support Groups, phone # and OP Services. BF Resources handout given and explained to parents. Enc family to call with questions/concerns. Mom has a Medela DEBP for home use. Enc parents to call desk for assistance with feeds as needed.    Maternal Data Formula Feeding for Exclusion: No Does the patient have breastfeeding experience prior to this delivery?: No  Feeding Feeding Type: Breast Fed Length of feed: 15 min  LATCH Score/Interventions Latch: Grasps breast easily, tongue down, lips flanged, rhythmical sucking. Intervention(s): Assist with latch;Breast massage;Breast compression  Audible Swallowing: A few with stimulation  Type of Nipple: Everted at rest and after stimulation  Comfort (Breast/Nipple): Soft / non-tender     Hold (Positioning): Assistance needed to  correctly position infant at breast and maintain latch. Intervention(s): Breastfeeding basics reviewed;Support Pillows;Position options;Skin to skin  LATCH Score: 8  Lactation Tools Discussed/Used WIC Program: No   Consult Status Consult Status: Follow-up Date: 12/28/14 Follow-up type: In-patient    Silas FloodSharon S Krystalle Pilkington 12/27/2014, 12:51 PM

## 2014-12-27 NOTE — Progress Notes (Signed)
PPD 1 SVD with 2nd degree repair  S:  Reports feeling ok - just tired             Tolerating po/ No nausea or vomiting             Bleeding is light             Pain controlled with motrin             Up ad lib / ambulatory / voiding QS  Newborn breast feeding  / Circumcision planned  O: VS: BP 85/51 mmHg  Pulse 73  Temp(Src) 98 F (36.7 C) (Oral)  Resp 16  Ht 5\' 2"  (1.575 m)  Wt 78.926 kg (174 lb)  BMI 31.82 kg/m2  SpO2 100%  LMP 01/26/2014  Breastfeeding? Unknown   LABS:              Recent Labs  12/25/14 2230 12/27/14 0723  WBC 9.6 9.6  HGB 10.8* 10.0*  PLT 381 321               Blood type: --/--/O POS, O POS (12/24 2230)  Rubella: Immune (05/25 0000)                     I&O: Intake/Output      12/25 0701 - 12/26 0700 12/26 0701 - 12/27 0700   Urine (mL/kg/hr) 2450 (1.3)    Blood 285 (0.2)    Total Output 2735     Net -2735                      Physical Exam:             Alert and oriented X3  Abdomen: soft, non-tender, non-distended              Fundus: firm, non-tender, Ueven  Perineum: moderate edema with ice pack in place  Lochia: moderate  Extremities: no edema, no calf pain or tenderness   A: PPD # 1 SVD with 2nd degree repair   Doing well - stable status  P: Routine post partum orders - anticipate DC tomorrow             Dr Juliene PinaMody in to discuss circ - planned later today after clearance from Peds   Marlinda MikeBAILEY, Eveleigh Crumpler CNM, MSN, [facility] 12/27/2014, 9:10 AM

## 2014-12-28 DIAGNOSIS — O99019 Anemia complicating pregnancy, unspecified trimester: Secondary | ICD-10-CM | POA: Diagnosis present

## 2014-12-28 MED ORDER — OXYCODONE-ACETAMINOPHEN 5-325 MG PO TABS: 1.0000 | ORAL_TABLET | ORAL | Status: DC | PRN

## 2014-12-28 MED ORDER — IBUPROFEN 600 MG PO TABS: 600.0000 mg | ORAL_TABLET | Freq: Four times a day (QID) | ORAL | Status: DC

## 2014-12-28 NOTE — Discharge Summary (Signed)
Obstetric Discharge Summary Reason for Admission: rupture of membranes and 38.[redacted] weeks gestation Prenatal Course: uncomplicated Intrapartum Procedures: spontaneous vaginal delivery, GBS prophylaxis and Pitocin, epidural, SROM >18 hrs Postpartum Procedures: none Complications-Operative and Postpartum: 2nd degree perineal laceration HEMOGLOBIN  Date Value Ref Range Status  12/27/2014 10.0* 12.0 - 15.0 g/dL Final   HCT  Date Value Ref Range Status  12/27/2014 31.2* 36.0 - 46.0 % Final    Physical Exam:  General: alert, cooperative and no distress Lochia: appropriate Uterine Fundus: firm Incision: healing well, no significant drainage, no dehiscence, no significant erythema DVT Evaluation: No evidence of DVT seen on physical exam. Negative Homan's sign. No cords or calf tenderness. No significant calf/ankle edema.  Discharge Diagnoses: Term Pregnancy-delivered, Anemia  Discharge Information: Date: 12/28/2014 Activity: pelvic rest Diet: routine Medications: PNV, Ibuprofen, Iron and Percocet Condition: stable Instructions: refer to practice specific booklet Discharge to: home Follow-up Information    [facility]. Schedule an appointment as soon as possible for a visit in 6 weeks.   Specialty:  Obstetrics and Gynecology   Contact information:   [address]el[address] Severe[address] [place] [id number] [phone]       Newborn Data: Live born female on 12/26/14 Birth Weight: 7 lb 1.2 oz (3210 g) APGAR: 8, 9  Home with mother.  Sanjay Broadfoot, N 12/28/2014, 9:49 AMl

## 2014-12-28 NOTE — Lactation Note (Signed)
This note was copied from the chart of Nancy Wells. Lactation Consultation Note  Patient Name: Nancy Wells MVHQI'OToday's Date: 12/28/2014 Reason for consult: Follow-up assessment   With this mom of a term baby, now 3137 hours old. The baby began cluster feeding last night, . Mom denies any nipple pain. She does have some blood expressed with hand expression from her irght nipple, one drop, and then cleared . i explained to mom that his can be normal, and that it will not hurt the baby. Breast care./engorgement care reviewed. Mom knows to call for questions/concerns.    Maternal Data    Feeding Feeding Type: Breast Fed Length of feed: 5 min  LATCH Score/Interventions Latch: Grasps breast easily, tongue down, lips flanged, rhythmical sucking. Intervention(s): Adjust position;Assist with latch;Breast massage  Audible Swallowing: A few with stimulation Intervention(s): Hand expression (easilye expressed colostrum, drop of blood from left breast at first, then clear)  Type of Nipple: Everted at rest and after stimulation  Comfort (Breast/Nipple): Soft / non-tender     Hold (Positioning): Assistance needed to correctly position infant at breast and maintain latch. Intervention(s): Breastfeeding basics reviewed;Support Pillows;Position options;Skin to skin  LATCH Score: 8  Lactation Tools Discussed/Used     Consult Status Consult Status: Complete Follow-up type: Call as needed    Alfred LevinsLee, Maye Parkinson Anne 12/28/2014, 9:02 AM

## 2014-12-28 NOTE — Progress Notes (Signed)
PPD #2- SVD  Subjective:   Reports feeling well HA last night-Percocet helped Tolerating po/ No nausea or vomiting Bleeding is light Pain controlled with Motrin and Percocet Up ad lib / ambulatory / voiding without problems Newborn: breastfeeding  / Circumcision: planning  Objective:   VS: VS:  Filed Vitals:   12/27/14 0423 12/27/14 1130 12/27/14 1738 12/28/14 0515  BP: 85/51 101/66 120/80 107/80  Pulse: 73 83 87 71  Temp:  98.3 F (36.8 C) 97.6 F (36.4 C) 98.1 F (36.7 C)  TempSrc:  Oral Oral   Resp:  18 18 18   Height:      Weight:      SpO2:  100%      LABS:  Recent Labs  12/25/14 2230 12/27/14 0723  WBC 9.6 9.6  HGB 10.8* 10.0*  PLT 381 321   Blood type: --/--/O POS, O POS (12/24 2230) Rubella: Immune (05/25 0000)                I&O: Intake/Output      12/26 0701 - 12/27 0700 12/27 0701 - 12/28 0700   Urine (mL/kg/hr)     Blood     Total Output       Net              Physical Exam: Alert and oriented X3 Abdomen: soft, non-tender, non-distended  Fundus: firm, non-tender, U-2 Perineum: Well approximated, no significant erythema, edema, or drainage; healing well. Lochia: small Extremities: No edema, no calf pain or tenderness   Assessment: PPD #2  G1P1001/ S/P:spontaneous vaginal, 2nd degree laceration Anemia Doing well - stable for discharge home  Plan: Discharge home RX's:  Ibuprofen 600mg  po Q 6 hrs prn pain #30 Refill x 0 Percocet 5/325 1 to 2 po Q 4 hrs prn pain #30 Refill x 0 Continue Ferralet (has Rx) [facility] booklet given    Nancy Wells, Nancy Wells, N MSN, CNM 12/28/2014, 9:46 AM

## 2015-06-22 ENCOUNTER — Encounter (HOSPITAL_COMMUNITY): Payer: Self-pay | Admitting: *Deleted

## 2015-06-22 ENCOUNTER — Emergency Department (HOSPITAL_COMMUNITY): Payer: 59

## 2015-06-22 ENCOUNTER — Emergency Department (HOSPITAL_COMMUNITY)
Admission: EM | Admit: 2015-06-22 | Discharge: 2015-06-22 | Disposition: A | Discharge status: Home / Self Care | Payer: 59 | Attending: Emergency Medicine | Admitting: Emergency Medicine

## 2015-06-22 DIAGNOSIS — Z79899 Other long term (current) drug therapy: Secondary | ICD-10-CM | POA: Diagnosis not present

## 2015-06-22 DIAGNOSIS — N201 Calculus of ureter: Secondary | ICD-10-CM

## 2015-06-22 DIAGNOSIS — R109 Unspecified abdominal pain: Secondary | ICD-10-CM | POA: Diagnosis present

## 2015-06-22 LAB — COMPREHENSIVE METABOLIC PANEL
ALK PHOS: 80 U/L (ref 38–126)
ALT: 14 U/L (ref 14–54)
ANION GAP: 6 (ref 5–15)
AST: 14 U/L — ABNORMAL LOW (ref 15–41)
Albumin: 3.8 g/dL (ref 3.5–5.0)
BILIRUBIN TOTAL: 0.8 mg/dL (ref 0.3–1.2)
BUN: 14 mg/dL (ref 6–20)
CALCIUM: 8.8 mg/dL — AB (ref 8.9–10.3)
CO2: 26 mmol/L (ref 22–32)
Chloride: 107 mmol/L (ref 101–111)
Creatinine, Ser: 0.59 mg/dL (ref 0.44–1.00)
GFR calc non Af Amer: >60 mL/min (ref >60)
Glucose, Bld: 135 mg/dL — ABNORMAL HIGH (ref 65–99)
Potassium: 3.3 mmol/L — ABNORMAL LOW (ref 3.5–5.1)
SODIUM: 139 mmol/L (ref 135–145)
TOTAL PROTEIN: 7 g/dL (ref 6.5–8.1)

## 2015-06-22 LAB — CBC WITH DIFFERENTIAL/PLATELET
BASOS ABS: 0 10*3/uL (ref 0.0–0.1)
BASOS PCT: 0 %
Eosinophils Absolute: 0.1 10*3/uL (ref 0.0–0.7)
Eosinophils Relative: 1 %
HEMATOCRIT: 35.5 % — AB (ref 36.0–46.0)
HEMOGLOBIN: 11.4 g/dL — AB (ref 12.0–15.0)
Lymphocytes Relative: 19 %
Lymphs Abs: 1.9 10*3/uL (ref 0.7–4.0)
MCH: 25.3 pg — ABNORMAL LOW (ref 26.0–34.0)
MCHC: 32.1 g/dL (ref 30.0–36.0)
MCV: 78.9 fL (ref 78.0–100.0)
Monocytes Absolute: 0.5 10*3/uL (ref 0.1–1.0)
Monocytes Relative: 5 %
NEUTROS ABS: 7.5 10*3/uL (ref 1.7–7.7)
NEUTROS PCT: 75 %
Platelets: 349 10*3/uL (ref 150–400)
RBC: 4.5 MIL/uL (ref 3.87–5.11)
RDW: 14.5 % (ref 11.5–15.5)
WBC: 10.1 10*3/uL (ref 4.0–10.5)

## 2015-06-22 LAB — URINE MICROSCOPIC-ADD ON

## 2015-06-22 LAB — URINALYSIS, ROUTINE W REFLEX MICROSCOPIC
Bilirubin Urine: NEGATIVE
Glucose, UA: NEGATIVE mg/dL
Ketones, ur: NEGATIVE mg/dL
LEUKOCYTES UA: NEGATIVE
NITRITE: NEGATIVE
PH: 5.5 (ref 5.0–8.0)
PROTEIN: NEGATIVE mg/dL
SPECIFIC GRAVITY, URINE: 1.013 (ref 1.005–1.030)

## 2015-06-22 LAB — I-STAT BETA HCG BLOOD, ED (MC, WL, AP ONLY): I-stat hCG, quantitative: <5 m[IU]/mL (ref <5)

## 2015-06-22 LAB — LIPASE, BLOOD: LIPASE: 11 U/L (ref 11–51)

## 2015-06-22 MED ORDER — OXYCODONE-ACETAMINOPHEN 5-325 MG PO TABS: 2.0000 | ORAL_TABLET | ORAL | Status: DC | PRN

## 2015-06-22 MED ORDER — SODIUM CHLORIDE 0.9 % IV BOLUS (SEPSIS)
1000.0000 mL | Freq: Once | INTRAVENOUS | Status: AC
  Administered 2015-06-22: 1000 mL via INTRAVENOUS

## 2015-06-22 MED ORDER — KETOROLAC TROMETHAMINE 30 MG/ML IJ SOLN
30.0000 mg | Freq: Once | INTRAMUSCULAR | Status: AC
  Administered 2015-06-22: 30 mg via INTRAVENOUS
  Filled 2015-06-22: qty 1

## 2015-06-22 MED ORDER — HYDROMORPHONE HCL 1 MG/ML IJ SOLN
1.0000 mg | Freq: Once | INTRAMUSCULAR | Status: AC
  Administered 2015-06-22: 1 mg via INTRAVENOUS
  Filled 2015-06-22: qty 1

## 2015-06-22 MED ORDER — TAMSULOSIN HCL 0.4 MG PO CAPS
0.4000 mg | ORAL_CAPSULE | Freq: Every day | ORAL | Status: DC
Start: 2015-06-22 — End: 2016-03-27

## 2015-06-22 MED ORDER — ONDANSETRON HCL 4 MG/2ML IJ SOLN
4.0000 mg | Freq: Once | INTRAMUSCULAR | Status: AC
  Administered 2015-06-22: 4 mg via INTRAVENOUS
  Filled 2015-06-22: qty 2

## 2015-06-22 MED ORDER — ONDANSETRON HCL 4 MG PO TABS: 4.0000 mg | ORAL_TABLET | Freq: Four times a day (QID) | ORAL | Status: DC

## 2015-06-22 NOTE — ED Notes (Signed)
Pt c/o right flank pain, radiating to abdomen, with n/v

## 2015-06-22 NOTE — ED Provider Notes (Signed)
CSN: [id number]     Arrival date & time 06/22/15  0539 History   First MD Initiated Contact with Patient 06/22/15 [phone]     Chief Complaint  Patient presents with  . Flank Pain    HPI Comments: 30 year old female presents with acute onset of right flank pain starting last night. Past medical history significant for anemia currently on iron therapy, migraines, pyelonephritis. Past surgical hx significant for appendectomy. She states the pain woke her from sleep. She has never had this pain before. It is on the right flank and radiates to the right groin. It is intermittent, sharp, stabbing. She feels like she can't get in a comfortable position. Nothing makes it better or worse. She reports associated nausea and vomiting. Denies fever, chills, chest pain, shortness of breath, abdominal pain, diarrhea, back pain, dysuria, frequency, hematuria, vaginal discharge, vaginal bleeding.  Patient is a 30 y.o. female presenting with flank pain.  Flank Pain Associated symptoms include nausea and vomiting. Pertinent negatives include no abdominal pain, chest pain, chills or fever.    Past Medical History  Diagnosis Date  . Anemia   . Migraines     no aura  . Pyelonephritis 2010  . Trigeminal neuralgia    Past Surgical History  Procedure Laterality Date  . Wisdom tooth extraction    . Laparoscopic appendectomy N/A 02/10/2014    Procedure: APPENDECTOMY LAPAROSCOPIC;  Surgeon: Chevis Pretty III, MD;  Location: [facility];  Service: General;  Laterality: N/A;   Family History  Problem Relation Age of Onset  . Osteoporosis Mother   . Cancer Father     skin  . Breast cancer Paternal Grandmother    Social History  Substance Use Topics  . Smoking status: Never Smoker   . Smokeless tobacco: None  . Alcohol Use: 2.0 oz/week    4 drink(s) per week   OB History    Gravida Para Term Preterm AB TAB SAB Ectopic Multiple Living   1 1 1  0 0 0 0 0 0 1     Review of Systems  Constitutional: Negative for fever  and chills.  Respiratory: Negative for shortness of breath.   Cardiovascular: Negative for chest pain.  Gastrointestinal: Positive for nausea and vomiting. Negative for abdominal pain.  Genitourinary: Positive for flank pain. Negative for dysuria, hematuria, vaginal bleeding, vaginal discharge and pelvic pain.  Musculoskeletal: Negative for back pain.  All other systems reviewed and are negative.   Allergies  Tegretol  Home Medications   Prior to Admission medications   Medication Sig Start Date End Date Taking? Authorizing Provider  Fe Cbn-Fe Gluc-FA-B12-C-DSS (FERRALET 90) 90-1 MG TABS Take 1 tablet by mouth at bedtime.    Historical Provider, MD  ibuprofen (ADVIL,MOTRIN) 600 MG tablet Take 1 tablet (600 mg total) by mouth every 6 (six) hours. 12/28/14   Donette Larry, CNM  oxyCODONE-acetaminophen (PERCOCET/ROXICET) 5-325 MG tablet Take 1-2 tablets by mouth every 4 (four) hours as needed (for pain scale greater than 7). 12/28/14   Donette Larry, CNM  Prenatal Vit-Fe Fumarate-FA (PRENATAL MULTIVITAMIN) TABS tablet Take 1 tablet by mouth at bedtime.     Historical Provider, MD   BP 127/107 mmHg  Pulse 74  Temp(Src) 98.1 F (36.7 C) (Oral)  Resp 22  SpO2 94%  LMP 05/29/2015   Physical Exam  Constitutional: She is oriented to person, place, and time. She appears well-developed and well-nourished. No distress.  Akasthesic  HENT:  Head: Normocephalic and atraumatic.  Eyes: Conjunctivae are normal. Pupils  are equal, round, and reactive to light. Right eye exhibits no discharge. Left eye exhibits no discharge. No scleral icterus.  Neck: Normal range of motion.  Cardiovascular: Normal rate, regular rhythm and intact distal pulses.  Exam reveals no gallop and no friction rub.   No murmur heard. Pulmonary/Chest: Effort normal and breath sounds normal. No respiratory distress. She has no wheezes. She has no rales. She exhibits no tenderness.  Abdominal: Soft. Bowel sounds are normal.  She exhibits no distension and no mass. There is tenderness. There is CVA tenderness. There is no rigidity, no rebound and no guarding.  R CVA tenderness. No anterior abdominal tenderness or pelvic tenderness  Musculoskeletal:  No thoracic or lumbar mid-line or paraspinal muscle tenderness. Moves all extremities freely x 4  Neurological: She is alert and oriented to person, place, and time.  Skin: Skin is warm and dry.  Psychiatric: She has a normal mood and affect. Her behavior is normal.    ED Course  Procedures (including critical care time) Labs Review Labs Reviewed  COMPREHENSIVE METABOLIC PANEL - Abnormal; Notable for the following:    Potassium 3.3 (*)    Glucose, Bld 135 (*)    Calcium 8.8 (*)    AST 14 (*)    All other components within normal limits  CBC WITH DIFFERENTIAL/PLATELET - Abnormal; Notable for the following:    Hemoglobin 11.4 (*)    HCT 35.5 (*)    MCH 25.3 (*)    All other components within normal limits  URINALYSIS, ROUTINE W REFLEX MICROSCOPIC (NOT AT [facility]) - Abnormal; Notable for the following:    APPearance CLOUDY (*)    Hgb urine dipstick MODERATE (*)    All other components within normal limits  URINE MICROSCOPIC-ADD ON - Abnormal; Notable for the following:    Squamous Epithelial / LPF 0-5 (*)    Bacteria, UA FEW (*)    All other components within normal limits  LIPASE, BLOOD  I-STAT BETA HCG BLOOD, ED ([facility], [facility], [facility] ONLY)    Imaging Review Ct Renal Stone Study  06/22/2015  CLINICAL DATA:  Right flank pain. Nausea and vomiting. History of pyelonephritis and appendectomy. EXAM: CT ABDOMEN AND PELVIS WITHOUT CONTRAST TECHNIQUE: Multidetector CT imaging of the abdomen and pelvis was performed following the standard protocol without IV contrast. COMPARISON:  02/10/2014 FINDINGS: Lower chest:  Unremarkable Hepatobiliary: 0.9 by 0.6 cm hypodense lesion anteriorly in the right hepatic lobe on image 8/2, only vaguely seen and seemingly potentially smaller on  the prior CT from 02/10/2014. The more peripheral and posterior hypodense lesion from 02/10/2014 is not well seen on today's noncontrast exam. Gallbladder unremarkable. Pancreas: Unremarkable Spleen: Unremarkable Adrenals/Urinary Tract: Adrenal glands normal. 2 mm right kidney upper pole nonobstructive calculus. 1 mm right kidney lower pole nonobstructive calculus. 1 mm left kidney upper pole nonobstructive calculus. There is a 3 mm calculus believed to be loose within the urinary bladder eccentric to the right on image 74/2. The there new calcifications in knee vicinity of the right UVJ compared to the prior exam, and on image 72 of series 2 the more lateral of the 2 calcifications is directly in the vicinity of the distal ureter, but does not have a tissue rim sign. Stomach/Bowel: Unremarkable aside from appendectomy. Vascular/Lymphatic: Unremarkable Reproductive: Unremarkable Other: No supplemental non-categorized findings. Musculoskeletal: Mild sclerosis along the left sacroiliac joint. IMPRESSION: 1. Mild right hydroureter noted, with a 3 mm calculus loose within the urinary bladder likely from recent passage into the urinary  bladder. In addition, there is a very questionable 2 mm right distal ureteral calculus near the UVJ. Although this is in the direct vicinity of the distal ureter course, I do not see a tissue rim sign which is normally present in the setting of a ureteral calculus and accordingly a could represent a new vascular calcification. There are several other new vascular calcifications in this area. 2. Additionally there are small nonobstructive bilateral renal calculi. 3. 9 by 6 mm hypodense lesion anteriorly in the right hepatic lobe on image 8/2, technically nonspecific due to small size, less conspicuous and/or smaller on prior CT from 02/10/2014. 4. Mild left unilateral chronic appearing sacroiliitis. Electronically Signed   By: Gaylyn RongWalter  Liebkemann M.D.   On: 06/22/2015 07:34   I have  personally reviewed and evaluated these images and lab results as part of my medical decision-making.   EKG Interpretation None      MDM   Final diagnoses:  Ureteral calculi   30 year old female presents with right flank pain consistent with a stone. Dilaudid, Toradol, Zofran, IVF started and on recheck patient reports good relief with this.. CT Renal shows 3 mm stone in the bladder as well as a possible 2 mm cyst down in the ureter. Urine is remarkable for moderate amount of hemoglobin and few bacteria but not overt infection. CMP remarkable for mild hypokalemia, mild hyperglycemia, and mild hypocalcemia. Kidney function is normal. CBC remarkable for mild anemia which appears improved from baseline. Lipase is normal, pregnancy test is negative. Patient will be discharged with pain medicine and Flomax and recommend urology follow-up. Patient is afebrile, NAD, non-toxic, with stable VS. Patient is informed of clinical course, understands medical decision making process, and agrees with plan. Opportunity for questions provided and all questions answered. Return precautions given.   Bethel BornKelly Marie Arianie Couse, PA-C 06/22/15 1054  Lavera Guiseana Duo Liu, MD 06/22/15 [phone]

## 2015-06-22 NOTE — Discharge Instructions (Signed)
Kidney Stones °Kidney stones (urolithiasis) are deposits that form inside your kidneys. The intense pain is caused by the stone moving through the urinary tract. When the stone moves, the ureter goes into spasm around the stone. The stone is usually passed in the urine.  °CAUSES  °· A disorder that makes certain neck glands produce too much parathyroid hormone (primary hyperparathyroidism). °· A buildup of uric acid crystals, similar to gout in your joints. °· Narrowing (stricture) of the ureter. °· A kidney obstruction present at birth (congenital obstruction). °· Previous surgery on the kidney or ureters. °· Numerous kidney infections. °SYMPTOMS  °· Feeling sick to your stomach (nauseous). °· Throwing up (vomiting). °· Blood in the urine (hematuria). °· Pain that usually spreads (radiates) to the groin. °· Frequency or urgency of urination. °DIAGNOSIS  °· Taking a history and physical exam. °· Blood or urine tests. °· CT scan. °· Occasionally, an examination of the inside of the urinary bladder (cystoscopy) is performed. °TREATMENT  °· Observation. °· Increasing your fluid intake. °· Extracorporeal shock wave lithotripsy--This is a noninvasive procedure that uses shock waves to break up kidney stones. °· Surgery may be needed if you have severe pain or persistent obstruction. There are various surgical procedures. Most of the procedures are performed with the use of small instruments. Only small incisions are needed to accommodate these instruments, so recovery time is minimized. °The size, location, and chemical composition are all important variables that will determine the proper choice of action for you. Talk to your health care provider to better understand your situation so that you will minimize the risk of injury to yourself and your kidney.  °HOME CARE INSTRUCTIONS  °· Drink enough water and fluids to keep your urine clear or pale yellow. This will help you to pass the stone or stone fragments. °· Strain  all urine through the provided strainer. Keep all particulate matter and stones for your health care provider to see. The stone causing the pain may be as small as a grain of salt. It is very important to use the strainer each and every time you pass your urine. The collection of your stone will allow your health care provider to analyze it and verify that a stone has actually passed. The stone analysis will often identify what you can do to reduce the incidence of recurrences. °· Only take over-the-counter or prescription medicines for pain, discomfort, or fever as directed by your health care provider. °· Keep all follow-up visits as told by your health care provider. This is important. °· Get follow-up X-rays if required. The absence of pain does not always mean that the stone has passed. It may have only stopped moving. If the urine remains completely obstructed, it can cause loss of kidney function or even complete destruction of the kidney. It is your responsibility to make sure X-rays and follow-ups are completed. Ultrasounds of the kidney can show blockages and the status of the kidney. Ultrasounds are not associated with any radiation and can be performed easily in a matter of minutes. °· Make changes to your daily diet as told by your health care provider. You may be told to: °¨ Limit the amount of salt that you eat. °¨ Eat 5 or more servings of fruits and vegetables each day. °¨ Limit the amount of meat, poultry, fish, and eggs that you eat. °· Collect a 24-hour urine sample as told by your health care provider. You may need to collect another urine sample every 6-12   months. °SEEK MEDICAL CARE IF: °· You experience pain that is progressive and unresponsive to any pain medicine you have been prescribed. °SEEK IMMEDIATE MEDICAL CARE IF:  °· Pain cannot be controlled with the prescribed medicine. °· You have a fever or shaking chills. °· The severity or intensity of pain increases over 18 hours and is not  relieved by pain medicine. °· You develop a new onset of abdominal pain. °· You feel faint or pass out. °· You are unable to urinate. °  °This information is not intended to replace advice given to you by your health care provider. Make sure you discuss any questions you have with your health care provider. °  °Document Released: 12/18/2004 Document Revised: 09/08/2014 Document Reviewed: 05/21/2012 °Elsevier Interactive Patient Education ©2016 [employer] ° °

## 2015-06-22 NOTE — ED Notes (Signed)
Pt ambulated to RR unassisted 

## 2015-06-22 NOTE — ED Notes (Signed)
Orders placed with verbal from [place], [place].

## 2015-09-09 LAB — OB RESULTS CONSOLE GC/CHLAMYDIA
Chlamydia: NEGATIVE
GC PROBE AMP, GENITAL: NEGATIVE

## 2015-09-09 LAB — OB RESULTS CONSOLE HIV ANTIBODY (ROUTINE TESTING): HIV: NONREACTIVE

## 2015-09-09 LAB — OB RESULTS CONSOLE ABO/RH: RH Type: POSITIVE

## 2015-09-09 LAB — OB RESULTS CONSOLE RUBELLA ANTIBODY, IGM: RUBELLA: IMMUNE

## 2015-09-09 LAB — OB RESULTS CONSOLE RPR: RPR: NONREACTIVE

## 2015-09-09 LAB — OB RESULTS CONSOLE ANTIBODY SCREEN: ANTIBODY SCREEN: NEGATIVE

## 2015-09-09 LAB — OB RESULTS CONSOLE HEPATITIS B SURFACE ANTIGEN: HEP B S AG: NEGATIVE

## 2016-01-02 NOTE — L&D Delivery Note (Signed)
Delivery Note At 5:47 PM a viable female was delivered via Vaginal, Spontaneous Delivery (Presentation: ;DOA  ).  APGAR: 8, 9; weight  .   Placenta status intact, spontaneous: , .  Cord:  #VC, tight nuchal, unable to deliver through, double clamped and cut on the perineuam with the following complications: none .  Cord pH: na  Anesthesia:  epidural Episiotomy: None Lacerations: None Suture Repair: n/a Est. Blood Loss (mL): 125  Mom to postpartum.  Baby to Couplet care / Skin to Skin.  Nancy Wells A. 03/27/2016, 5:54 PM

## 2016-03-07 LAB — OB RESULTS CONSOLE GBS: GBS: NEGATIVE

## 2016-03-21 ENCOUNTER — Other Ambulatory Visit: Payer: Self-pay | Admitting: Obstetrics & Gynecology

## 2016-03-26 ENCOUNTER — Encounter (HOSPITAL_COMMUNITY): Payer: Self-pay | Admitting: *Deleted

## 2016-03-26 ENCOUNTER — Telehealth (HOSPITAL_COMMUNITY): Payer: Self-pay | Admitting: *Deleted

## 2016-03-26 NOTE — Telephone Encounter (Signed)
Preadmission screen  

## 2016-03-27 ENCOUNTER — Encounter (HOSPITAL_COMMUNITY): Payer: Self-pay | Admitting: *Deleted

## 2016-03-27 ENCOUNTER — Inpatient Hospital Stay (HOSPITAL_COMMUNITY): Payer: 59 | Admitting: Anesthesiology

## 2016-03-27 ENCOUNTER — Inpatient Hospital Stay (HOSPITAL_COMMUNITY)
Admission: AD | Admit: 2016-03-27 | Discharge: 2016-03-28 | DRG: 775 | Disposition: A | Discharge status: Home / Self Care | Payer: 59 | Source: Ambulatory Visit | Attending: Obstetrics & Gynecology | Admitting: Obstetrics & Gynecology

## 2016-03-27 ENCOUNTER — Inpatient Hospital Stay (HOSPITAL_COMMUNITY): Payer: 59

## 2016-03-27 DIAGNOSIS — Z3A39 39 weeks gestation of pregnancy: Secondary | ICD-10-CM

## 2016-03-27 DIAGNOSIS — Z3493 Encounter for supervision of normal pregnancy, unspecified, third trimester: Secondary | ICD-10-CM | POA: Diagnosis present

## 2016-03-27 LAB — CBC
HEMATOCRIT: 35.9 % — AB (ref 36.0–46.0)
HEMOGLOBIN: 12 g/dL (ref 12.0–15.0)
MCH: 26.8 pg (ref 26.0–34.0)
MCHC: 33.4 g/dL (ref 30.0–36.0)
MCV: 80.3 fL (ref 78.0–100.0)
Platelets: 362 10*3/uL (ref 150–400)
RBC: 4.47 MIL/uL (ref 3.87–5.11)
RDW: 14.7 % (ref 11.5–15.5)
WBC: 9.7 10*3/uL (ref 4.0–10.5)

## 2016-03-27 LAB — TYPE AND SCREEN
ABO/RH(D): O POS
ANTIBODY SCREEN: NEGATIVE

## 2016-03-27 MED ORDER — OXYTOCIN 40 UNITS IN LACTATED RINGERS INFUSION - SIMPLE MED: 1.0000 m[IU]/min | INTRAVENOUS | Status: DC

## 2016-03-27 MED ORDER — BENZOCAINE-MENTHOL 20-0.5 % EX AERO: 1.0000 "application " | INHALATION_SPRAY | CUTANEOUS | Status: DC | PRN

## 2016-03-27 MED ORDER — FLEET ENEMA 7-19 GM/118ML RE ENEM: 1.0000 | ENEMA | RECTAL | Status: DC | PRN

## 2016-03-27 MED ORDER — OXYTOCIN 40 UNITS IN LACTATED RINGERS INFUSION - SIMPLE MED
1.0000 m[IU]/min | INTRAVENOUS | Status: DC
  Administered 2016-03-27: 1 m[IU]/min via INTRAVENOUS
  Filled 2016-03-27: qty 1000

## 2016-03-27 MED ORDER — LIDOCAINE HCL (PF) 1 % IJ SOLN
30.0000 mL | INTRAMUSCULAR | Status: DC | PRN
  Filled 2016-03-27 (×2): qty 30

## 2016-03-27 MED ORDER — EPHEDRINE 5 MG/ML INJ
10.0000 mg | INTRAVENOUS | Status: DC | PRN
  Filled 2016-03-27: qty 2

## 2016-03-27 MED ORDER — ACETAMINOPHEN 325 MG PO TABS: 650.0000 mg | ORAL_TABLET | ORAL | Status: DC | PRN

## 2016-03-27 MED ORDER — OXYCODONE-ACETAMINOPHEN 5-325 MG PO TABS: 1.0000 | ORAL_TABLET | ORAL | Status: DC | PRN

## 2016-03-27 MED ORDER — ZOLPIDEM TARTRATE 5 MG PO TABS: 5.0000 mg | ORAL_TABLET | Freq: Every evening | ORAL | Status: DC | PRN

## 2016-03-27 MED ORDER — FENTANYL 2.5 MCG/ML BUPIVACAINE 1/10 % EPIDURAL INFUSION (WH - ANES)
14.0000 mL/h | INTRAMUSCULAR | Status: DC | PRN
  Administered 2016-03-27: 14 mL/h via EPIDURAL
  Filled 2016-03-27: qty 100

## 2016-03-27 MED ORDER — LACTATED RINGERS IV SOLN
500.0000 mL | INTRAVENOUS | Status: DC | PRN
  Administered 2016-03-27: 1000 mL via INTRAVENOUS

## 2016-03-27 MED ORDER — TETANUS-DIPHTH-ACELL PERTUSSIS 5-2.5-18.5 LF-MCG/0.5 IM SUSP: 0.5000 mL | Freq: Once | INTRAMUSCULAR | Status: DC

## 2016-03-27 MED ORDER — SOD CITRATE-CITRIC ACID 500-334 MG/5ML PO SOLN: 30.0000 mL | ORAL | Status: DC | PRN

## 2016-03-27 MED ORDER — IBUPROFEN 600 MG PO TABS
600.0000 mg | ORAL_TABLET | Freq: Four times a day (QID) | ORAL | Status: DC
  Administered 2016-03-27 – 2016-03-28 (×4): 600 mg via ORAL
  Filled 2016-03-27 (×4): qty 1

## 2016-03-27 MED ORDER — WITCH HAZEL-GLYCERIN EX PADS: 1.0000 "application " | MEDICATED_PAD | CUTANEOUS | Status: DC | PRN

## 2016-03-27 MED ORDER — OXYTOCIN 40 UNITS IN LACTATED RINGERS INFUSION - SIMPLE MED: 2.5000 [IU]/h | INTRAVENOUS | Status: DC

## 2016-03-27 MED ORDER — DIBUCAINE 1 % RE OINT: 1.0000 "application " | TOPICAL_OINTMENT | RECTAL | Status: DC | PRN

## 2016-03-27 MED ORDER — DIPHENHYDRAMINE HCL 25 MG PO CAPS
25.0000 mg | ORAL_CAPSULE | Freq: Four times a day (QID) | ORAL | Status: DC | PRN
Start: 2016-03-27 — End: 2016-03-29

## 2016-03-27 MED ORDER — ONDANSETRON HCL 4 MG/2ML IJ SOLN: 4.0000 mg | Freq: Four times a day (QID) | INTRAMUSCULAR | Status: DC | PRN

## 2016-03-27 MED ORDER — TERBUTALINE SULFATE 1 MG/ML IJ SOLN
0.2500 mg | Freq: Once | INTRAMUSCULAR | Status: DC | PRN
  Filled 2016-03-27: qty 1

## 2016-03-27 MED ORDER — PHENYLEPHRINE 40 MCG/ML (10ML) SYRINGE FOR IV PUSH (FOR BLOOD PRESSURE SUPPORT)
80.0000 ug | PREFILLED_SYRINGE | INTRAVENOUS | Status: DC | PRN
  Filled 2016-03-27: qty 5

## 2016-03-27 MED ORDER — LIDOCAINE HCL (PF) 1 % IJ SOLN
INTRAMUSCULAR | Status: DC | PRN
  Administered 2016-03-27: 13 mL via EPIDURAL

## 2016-03-27 MED ORDER — COCONUT OIL OIL: 1.0000 "application " | TOPICAL_OIL | Status: DC | PRN

## 2016-03-27 MED ORDER — SIMETHICONE 80 MG PO CHEW: 80.0000 mg | CHEWABLE_TABLET | ORAL | Status: DC | PRN

## 2016-03-27 MED ORDER — LACTATED RINGERS IV SOLN
INTRAVENOUS | Status: DC
  Administered 2016-03-27: 14:00:00 via INTRAVENOUS

## 2016-03-27 MED ORDER — ONDANSETRON HCL 4 MG/2ML IJ SOLN: 4.0000 mg | INTRAMUSCULAR | Status: DC | PRN

## 2016-03-27 MED ORDER — LACTATED RINGERS IV SOLN: 500.0000 mL | Freq: Once | INTRAVENOUS | Status: DC

## 2016-03-27 MED ORDER — PRENATAL MULTIVITAMIN CH
1.0000 | ORAL_TABLET | Freq: Every day | ORAL | Status: DC
  Administered 2016-03-28: 1 via ORAL
  Filled 2016-03-27: qty 1

## 2016-03-27 MED ORDER — OXYTOCIN BOLUS FROM INFUSION
500.0000 mL | Freq: Once | INTRAVENOUS | Status: AC
  Administered 2016-03-27: 500 mL/h via INTRAVENOUS

## 2016-03-27 MED ORDER — ONDANSETRON HCL 4 MG PO TABS: 4.0000 mg | ORAL_TABLET | ORAL | Status: DC | PRN

## 2016-03-27 MED ORDER — DIPHENHYDRAMINE HCL 50 MG/ML IJ SOLN: 12.5000 mg | INTRAMUSCULAR | Status: DC | PRN

## 2016-03-27 MED ORDER — OXYCODONE-ACETAMINOPHEN 5-325 MG PO TABS: 2.0000 | ORAL_TABLET | ORAL | Status: DC | PRN

## 2016-03-27 MED ORDER — PHENYLEPHRINE 40 MCG/ML (10ML) SYRINGE FOR IV PUSH (FOR BLOOD PRESSURE SUPPORT)
80.0000 ug | PREFILLED_SYRINGE | INTRAVENOUS | Status: DC | PRN
  Filled 2016-03-27: qty 5
  Filled 2016-03-27: qty 10

## 2016-03-27 MED ORDER — OXYCODONE HCL 5 MG PO TABS: 10.0000 mg | ORAL_TABLET | ORAL | Status: DC | PRN

## 2016-03-27 MED ORDER — TERBUTALINE SULFATE 1 MG/ML IJ SOLN: 0.2500 mg | Freq: Once | INTRAMUSCULAR | Status: DC | PRN

## 2016-03-27 MED ORDER — OXYCODONE HCL 5 MG PO TABS: 5.0000 mg | ORAL_TABLET | ORAL | Status: DC | PRN

## 2016-03-27 MED ORDER — SENNOSIDES-DOCUSATE SODIUM 8.6-50 MG PO TABS
2.0000 | ORAL_TABLET | ORAL | Status: DC
  Administered 2016-03-27: 2 via ORAL
  Filled 2016-03-27: qty 2

## 2016-03-27 NOTE — MAU Note (Signed)
Pt presents to MAU with complaints of contractions since last night. Pt scheduled for induction tomorrow. Denies any LOF, reports bloody show

## 2016-03-27 NOTE — Anesthesia Preprocedure Evaluation (Signed)
Anesthesia Evaluation  Patient identified by MRN, date of birth, ID band Patient awake    Reviewed: Allergy & Precautions, Patient's Chart, lab work & pertinent test results  History of Anesthesia Complications Negative for: history of anesthetic complications  Airway Mallampati: II  TM Distance: >3 FB Neck ROM: Full    Dental  (+) Teeth Intact   Pulmonary neg pulmonary ROS,    breath sounds clear to auscultation       Cardiovascular negative cardio ROS   Rhythm:Regular     Neuro/Psych negative neurological ROS  negative psych ROS   GI/Hepatic negative GI ROS, Neg liver ROS,   Endo/Other  negative endocrine ROS  Renal/GU negative Renal ROS     Musculoskeletal   Abdominal   Peds  Hematology negative hematology ROS (+) anemia ,   Anesthesia Other Findings   Reproductive/Obstetrics (+) Pregnancy                             Anesthesia Physical Anesthesia Plan  ASA: II  Anesthesia Plan: Epidural   Post-op Pain Management:    Induction:   Airway Management Planned:   Additional Equipment:   Intra-op Plan:   Post-operative Plan:   Informed Consent: I have reviewed the patients History and Physical, chart, labs and discussed the procedure including the risks, benefits and alternatives for the proposed anesthesia with the patient or authorized representative who has indicated his/her understanding and acceptance.   Dental advisory given  Plan Discussed with: Anesthesiologist  Anesthesia Plan Comments:         Anesthesia Quick Evaluation  

## 2016-03-27 NOTE — Anesthesia Procedure Notes (Signed)
Epidural Patient location during procedure: OB Start time: 03/27/2016 1:47 PM End time: 03/27/2016 2:09 PM  Staffing Anesthesiologist: Anitra LauthMILLER, Dejohn Ibarra RAY Performed: anesthesiologist   Preanesthetic Checklist Completed: patient identified, site marked, surgical consent, pre-op evaluation, timeout performed, IV checked, risks and benefits discussed and monitors and equipment checked  Epidural Patient position: sitting Prep: DuraPrep Patient monitoring: heart rate, cardiac monitor, continuous pulse ox and blood pressure Approach: midline Location: L2-L3 Injection technique: LOR saline  Needle:  Needle type: Tuohy  Needle gauge: 17 G Needle length: 9 cm Needle insertion depth: 6 cm Catheter type: closed end flexible Catheter size: 20 Guage Catheter at skin depth: 9 cm Test dose: negative  Assessment Events: blood not aspirated, injection not painful, no injection resistance, negative IV test and no paresthesia  Additional Notes Reason for block:procedure for pain

## 2016-03-27 NOTE — Anesthesia Pain Management Evaluation Note (Signed)
  CRNA Pain Management Visit Note  Patient: Nancy Wells, 31 y.o., female  "Hello I am a member of the anesthesia team at [facility]. We have an anesthesia team available at all times to provide care throughout the hospital, including epidural management and anesthesia for C-section. I don't know your plan for the delivery whether it a natural birth, water birth, IV sedation, nitrous supplementation, doula or epidural, but we want to meet your pain goals."   1.Was your pain managed to your expectations on prior hospitalizations?   Yes   2.What is your expectation for pain management during this hospitalization?     Epidural  3.How can we help you reach that goal? epidural  Record the patient's initial score and the patient's pain goal.   Pain: 7  Pain Goal: 8 [facility] wants you to be able to say your pain was always managed very well.  Edison PaceWILKERSON,Loucinda Croy 03/27/2016

## 2016-03-27 NOTE — H&P (Signed)
Nancy Wells is a 31 y.o. female G2P1001 6672w0d presenting for UCs  HPI/HPP:  UCs q4-5 min all night.  Tired.  Good FMs.  No AF leak.  No vaginal bleeding.  No PEC Sx.  Induction Multip/term/favorable cervix was planned for tomorrow am by primary Dr Juliene PinaMody.  H/O Term SVD.  OB History    Gravida Para Term Preterm AB Living   2 1 1  0 0 1   SAB TAB Ectopic Multiple Live Births   0 0 0 0 1     Past Medical History:  Diagnosis Date  . Anemia   . Depression   . Hx of varicella   . Kidney stone   . Migraines    no aura  . Pyelonephritis 2010  . Trigeminal neuralgia    Past Surgical History:  Procedure Laterality Date  . LAPAROSCOPIC APPENDECTOMY N/A 02/10/2014   Procedure: APPENDECTOMY LAPAROSCOPIC;  Surgeon: Chevis PrettyPaul Toth III, MD;  Location: [facility] ORS;  Service: General;  Laterality: N/A;  . WISDOM TOOTH EXTRACTION     Family History: family history includes Breast cancer in her paternal grandmother; Cancer in her father; Kidney Stones in her father; Osteoporosis in her mother. Social History:  reports that she has never smoked. She has never used smokeless tobacco. She reports that she drinks about 2.0 oz of alcohol per week . She reports that she does not use drugs. No current facility-administered medications for this encounter.  Allergies  Allergen Reactions  . Tegretol [Carbamazepine]     Unable to walk/ unable to see    Dilation: 3 Effacement (%): 70 Station: -3 Exam by:: Dorrene GermanJ. Lowe RN   Blood pressure 111/73, pulse 87, temperature 97.7 F (36.5 C), resp. rate 18, height 5\' 3"  (1.6 m), weight 172 lb (78 kg), unknown if currently breastfeeding. Exam Physical Exam   FHR 115-120's with good variability and accelerations.  No deceleration. UCs slowed down to q5-6 min.  HPP:  Patient Active Problem List   Diagnosis Date Noted  . Anemia affecting pregnancy 12/28/2014  . Postpartum care following vaginal delivery (12/25) 12/26/2014  . SVD (spontaneous vaginal delivery) 12/26/2014   . Premature rupture of membranes 12/25/2014  . Acute appendicitis 02/10/2014    Prenatal labs: ABO, Rh: O/Positive/-- (09/08 0000) Antibody: Negative (09/08 0000) Rubella: Immune RPR: Nonreactive (09/08 0000)  HBsAg: Negative (09/08 0000)  HIV: Non-reactive (09/08 0000)  Genetic testing: Ultrascreen neg.  AFP1 neg US anato: Female wnl 1 hr GTT: 131 wnl GBS: Negative (03/07 0000)   Assessment/Plan: [age] yo G2P1 previous FT SVD at 39 wks currently with prolonged latent phase.  Induction was planned tomorrow am, but patient prefers to proceed today.  FHR Cat 1.  GBS neg.  Admit and start low dose Pitocin.  Epidural PRN.   Marie-Lyne Yamina Lenis 03/27/2016, 10:49 AM

## 2016-03-27 NOTE — Progress Notes (Signed)
S: Doing well, no complaints, pain well controlled with epidural  O: BP 111/81   Pulse 90   Temp 98.2 F (36.8 C) (Oral)   Resp 16   Ht 5\' 3"  (1.6 m)   Wt 78 kg (172 lb)   BMI 30.47 kg/m    FHT:  FHR: 135s bpm, variability: moderate,  accelerations:  Present,  decelerations:  Absent UC:   regular, every 4 minutes SVE:   Dilation: 3 Effacement (%): 70 Station: -3 Exam by:: Ferne CoeS Earl RNC  Attempted AROM but no return  A / P:  30 y.o.  Obstetric History   G2   P1   T1   P0   A0   L1    SAB0   TAB0   Ectopic0   Multiple0   Live Births1    at 5311w0d Augmentation of labor due to prolonged prodrome and pt discomfort. Slow progress, continue to increase pitocin. If not clearly with ROM in the next few hrs will attempt AROM again  Fetal Wellbeing:  Category I Pain Control:  Epidural  Anticipated MOD:  NSVD  Vint Pola A. 03/27/2016, 2:57 PM

## 2016-03-27 NOTE — MAU Note (Signed)
Dr. Seymour BarsLavoie in to see pt., options discussed, pt to be admitted, MD states she is putting in orders.

## 2016-03-27 NOTE — MAU Note (Signed)
Pt & her husband have been informed of BPP, they are not sure they want this, they feel it may be a waste of their time & money.  Explained it is for fetal well being, they wish to speak with Dr. Seymour BarsLavoie.  Dr. Seymour BarsLavoie informed, she is coming to see them.

## 2016-03-27 NOTE — Lactation Note (Signed)
This note was copied from a baby's chart. Lactation Consultation Note  Patient Name: Nancy Wells Reason for consult: Initial assessment  Initial visit at 4 hours of age.  Mom reports feedings are going well and mom has experience with older child nursing for 6 months. Room is dark and mom is comforting baby at crib. [facility] resources given and discussed.  Encouraged to feed with early cues on demand.  Early newborn behavior discussed.  Hand expression reported by mom with colostrum visible.  Mom to call for assist as needed.     Maternal Data Has patient been taught Hand Expression?: Yes Does the patient have breastfeeding experience prior to this delivery?: Yes  Feeding Feeding Type: Breast Fed  LATCH Score/Interventions Latch: Repeated attempts needed to sustain latch, nipple held in mouth throughout feeding, stimulation needed to elicit sucking reflex. Intervention(s): Adjust position;Assist with latch  Audible Swallowing: None Intervention(s): Skin to skin;Hand expression  Type of Nipple: Everted at rest and after stimulation  Comfort (Breast/Nipple): Soft / non-tender     Hold (Positioning): Assistance needed to correctly position infant at breast and maintain latch. Intervention(s): Breastfeeding basics reviewed  LATCH Score: 6  Lactation Tools Discussed/Used     Consult Status Consult Status: Follow-up Date: 03/28/16 Follow-up type: In-patient    Azaya Goedde, Arvella MerlesJana Lynn Wells, 10:45 PM

## 2016-03-28 ENCOUNTER — Inpatient Hospital Stay (HOSPITAL_COMMUNITY): Admission: RE | Admit: 2016-03-28 | Payer: 59 | Source: Ambulatory Visit

## 2016-03-28 LAB — RPR: RPR Ser Ql: NONREACTIVE

## 2016-03-28 MED ORDER — IBUPROFEN 600 MG PO TABS: 600.0000 mg | ORAL_TABLET | Freq: Four times a day (QID) | ORAL | 0 refills | Status: AC

## 2016-03-28 NOTE — Anesthesia Postprocedure Evaluation (Signed)
Anesthesia Post Note  Patient: Nancy Wells  Procedure(s) Performed: * No procedures listed *  Patient location during evaluation: Mother Baby Anesthesia Type: Epidural Level of consciousness: awake Pain management: pain level controlled Vital Signs Assessment: post-procedure vital signs reviewed and stable Respiratory status: spontaneous breathing Cardiovascular status: stable Postop Assessment: no headache, no backache, epidural receding and patient able to bend at knees Anesthetic complications: no        Last Vitals:  Vitals:   03/28/16 0553 03/28/16 0820  BP: 120/68 111/68  Pulse: 72 85  Resp: 18 16  Temp: 36.8 C 36.4 C    Last Pain:  Vitals:   03/28/16 0820  TempSrc: Oral  PainSc: 3    Pain Goal: Patients Stated Pain Goal: 3 (03/28/16 0715)               Edison PaceWILKERSON,Delrose Rohwer

## 2016-03-28 NOTE — Discharge Summary (Signed)
Obstetric Discharge Summary Reason for Admission: onset of labor Prenatal Procedures: ultrasound Intrapartum Procedures: spontaneous vaginal delivery Postpartum Procedures: none Complications-Operative and Postpartum: none Hemoglobin  Date Value Ref Range Status  03/27/2016 12.0 12.0 - 15.0 g/dL Final   Hemoglobin, fingerstick  Date Value Ref Range Status  10/30/2013 12.1 12.0 - 16.0 g/dL Final   HCT  Date Value Ref Range Status  03/27/2016 35.9 (L) 36.0 - 46.0 % Final    Physical Exam:  General: alert and cooperative Lochia: appropriate Uterine Fundus: firm Incision: N./A DVT Evaluation: No evidence of DVT seen on physical exam.  Discharge Diagnoses: Term Pregnancy-delivered  Discharge Information: Date: 03/28/2016 Activity: pelvic rest Diet: routine Medications: PNV and Ibuprofen Condition: stable Instructions: refer to practice specific booklet Discharge to: home Follow-up Information    Ambika Zettlemoyer R, MD Follow up in 6 week(s).   Specialty:  Obstetrics and Gynecology Contact information: [place] [place] [id number] [phone]           Newborn Data: Live born female  Birth Weight: 6 lb 13.9 oz (3115 g) APGAR: 8, 9  Home with mother.  Olson Lucarelli R 03/28/2016, 7:54 PM

## 2016-03-28 NOTE — Lactation Note (Signed)
This note was copied from a baby's chart. Lactation Consultation Note Follow up visit at 26 hours of age.  Mom sitting on couch holding baby and Rn at bedside assisting with discharge.  Mom reports feedings are going well. Mom denies concerns about engorgement or breast care at home.  LC reminded mom of olp services available as needed.    Patient Name: Nancy Wells [id number]'s Date: 03/28/2016     Maternal Data    Feeding Feeding Type: Breast Fed Length of feed: 20 min  LATCH Score/Interventions                      Lactation Tools Discussed/Used     Consult Status      Diego Delancey, Arvella MerlesJana Lynn 03/28/2016, 8:11 PM

## 2016-03-28 NOTE — Lactation Note (Signed)
This note was copied from a baby's chart. Lactation Consultation Note  Patient Name: Nancy Wells  [facility] discharge consult at 22 hours of age.  FOB reports mom is asleep.  LC advised I will follow up later as needed.        Maternal Data    Feeding Feeding Type: Breast Fed Length of feed: 0 min  LATCH Score/Interventions                      Lactation Tools Discussed/Used     Consult Status      Alese Furniss, Arvella MerlesJana Lynn Wells, 4:25 PM

## 2016-03-28 NOTE — Progress Notes (Signed)
Post Partum Day 1 S/P spontaneous vaginal  Feeding: breast Subjective: No HA, SOB, CP, F/C, breast symptoms. Normal vaginal bleeding, no clots. Pain controlled.  ambulating without symptoms.  voiding without difficulty    Objective: BP 111/68 (BP Location: Right Arm)   Pulse 85   Temp 97.6 F (36.4 C) (Oral)   Resp 16   Ht 5\' 3"  (1.6 m)   Wt 78 kg (172 lb)   Breastfeeding? Unknown   BMI 30.47 kg/m  I&O reviewed.   Physical Exam:  General: alert and no distress Lochia: appropriate Uterine Fundus: firm DVT Evaluation: No evidence of DVT seen on physical exam. Ext: No c/c/e  Recent Labs  03/27/16 1155  HGB 12.0  HCT 35.9*      Assessment/Plan: 31 y.o.  PPD #1 .  normal postpartum exam Continue current postpartum care Ambulate Interested in early d/c though baby will need to stay 24 hrs, consider d/c at 6p tonight.    LOS: 1 day   Mayan Dolney A. 03/28/2016 8:54 AM

## 2017-02-21 ENCOUNTER — Emergency Department (HOSPITAL_COMMUNITY): Admission: EM | Admit: 2017-02-21 | Discharge: 2017-02-21 | Discharge status: Absent without leave | Payer: 59

## 2017-03-12 ENCOUNTER — Ambulatory Visit (HOSPITAL_COMMUNITY)
Admission: RE | Admit: 2017-03-12 | Discharge: 2017-03-12 | Disposition: A | Discharge status: Home / Self Care | Payer: Managed Care, Other (non HMO) | Source: Ambulatory Visit | Attending: Obstetrics & Gynecology | Admitting: Obstetrics & Gynecology

## 2017-03-12 ENCOUNTER — Other Ambulatory Visit (HOSPITAL_COMMUNITY): Payer: Self-pay | Admitting: Obstetrics & Gynecology

## 2017-03-12 DIAGNOSIS — M7989 Other specified soft tissue disorders: Principal | ICD-10-CM

## 2017-03-12 DIAGNOSIS — M79604 Pain in right leg: Secondary | ICD-10-CM | POA: Diagnosis not present

## 2017-03-12 NOTE — Progress Notes (Signed)
Right lower extremity venous duplex has been completed. Negative for DVT. Results were given to Dr. Juliene PinaMody.  03/12/17 3:20 PM Olen CordialGreg Haydon Dorris RVT

## 2017-08-20 IMAGING — CT CT RENAL STONE PROTOCOL
2 of 3 series · 15 of 46 positions shown, 17 images · non-contrast
Comparison: 02/10/2014

CLINICAL DATA: Right flank pain. Nausea and vomiting. History of
pyelonephritis and appendectomy.

EXAM:
CT ABDOMEN AND PELVIS WITHOUT CONTRAST
TECHNIQUE: Multidetector CT imaging of the abdomen and pelvis was performed
following the standard protocol without IV contrast.

[Series 3: lung · axial · 0.64mm/px · z∈[+1430,+1498]mm · 12 of 40 slices shown, 14 images]
[im 3/40  soft-tissue]
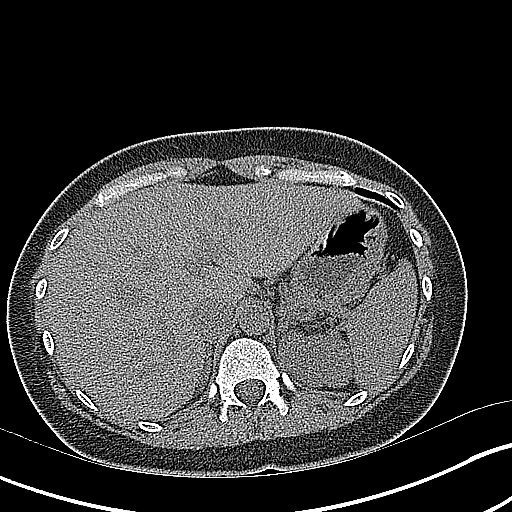
[im 3/40  bone]
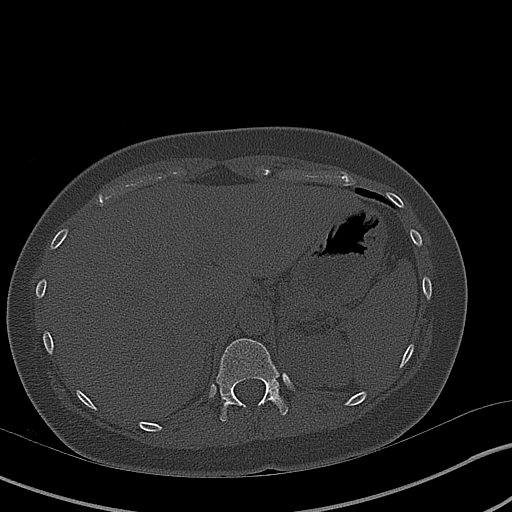
[im 6/40  soft-tissue]
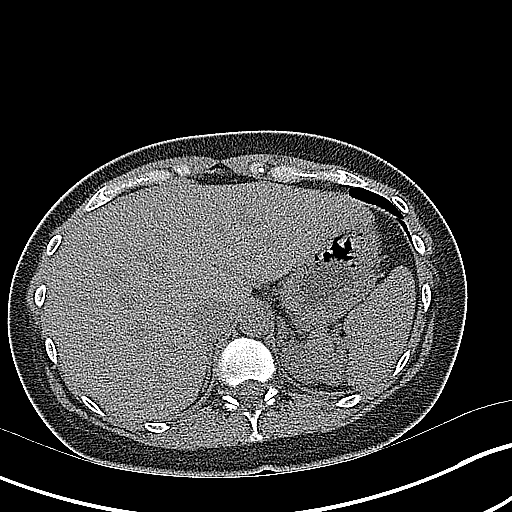
[im 9/40  soft-tissue]
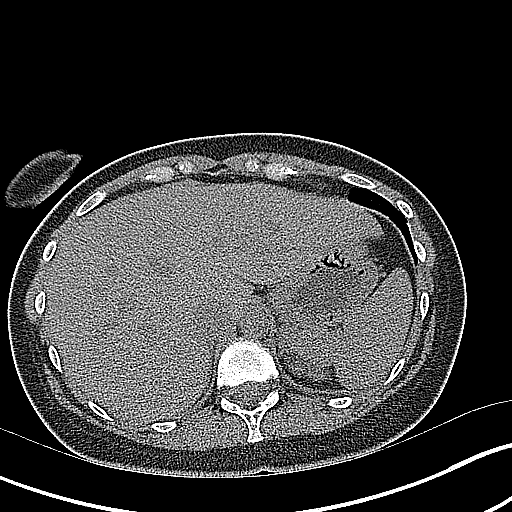
[im 12/40  soft-tissue]
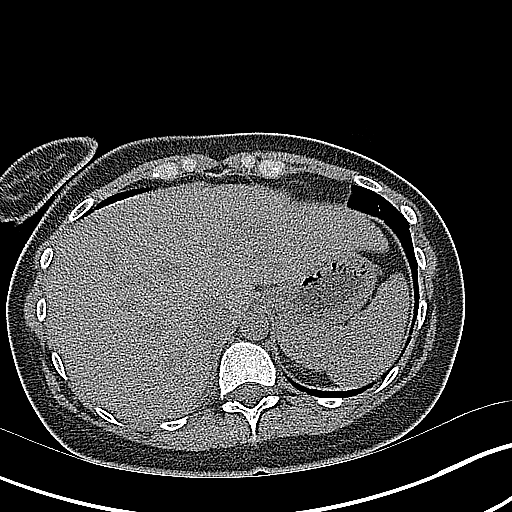
[im 16/40  soft-tissue]
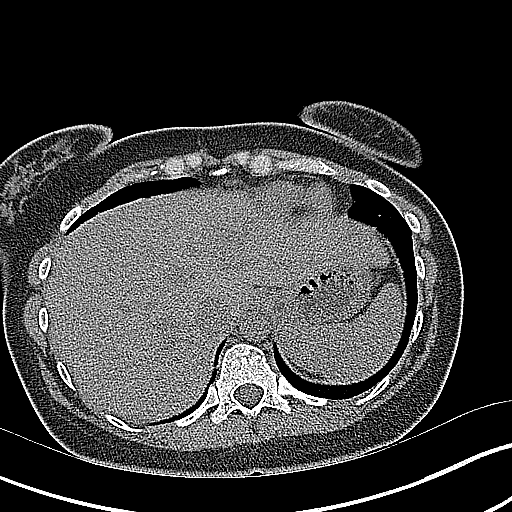
[im 18/40  soft-tissue]
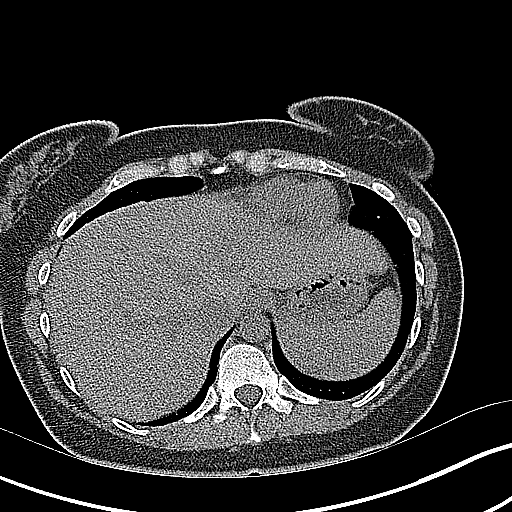
[im 22/40  soft-tissue]
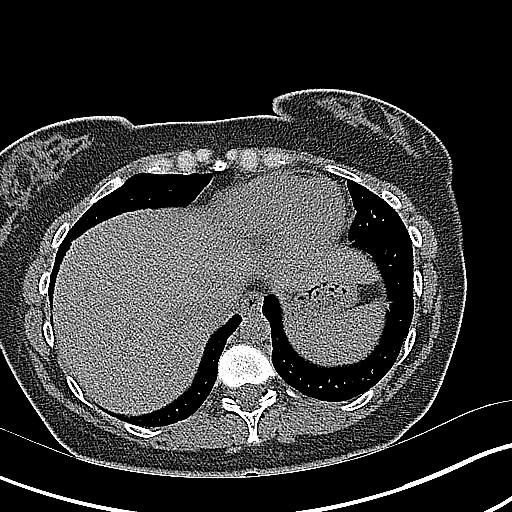
[im 24/40  soft-tissue]
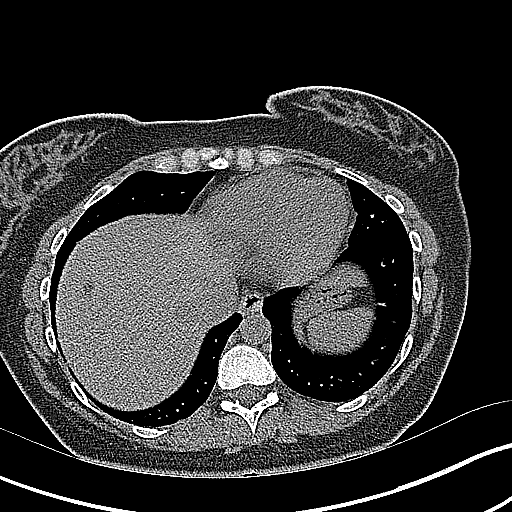
[im 28/40  soft-tissue]
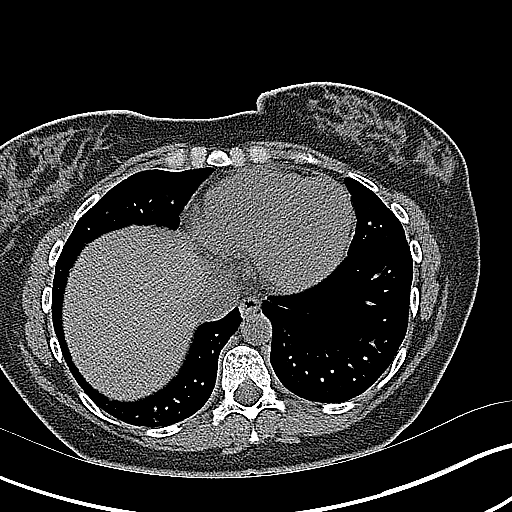
[im 28/40  bone]
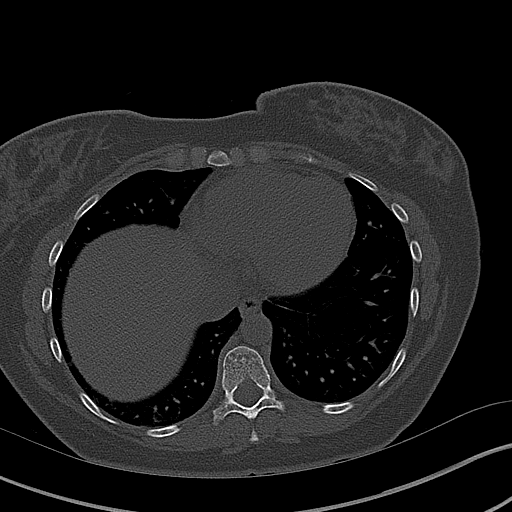
[im 31/40  soft-tissue]
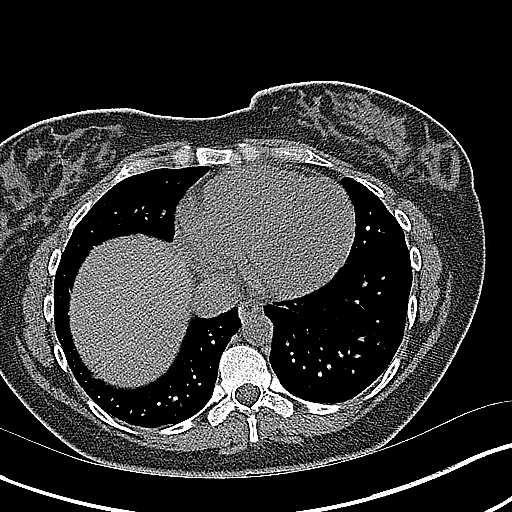
[im 34/40  soft-tissue]
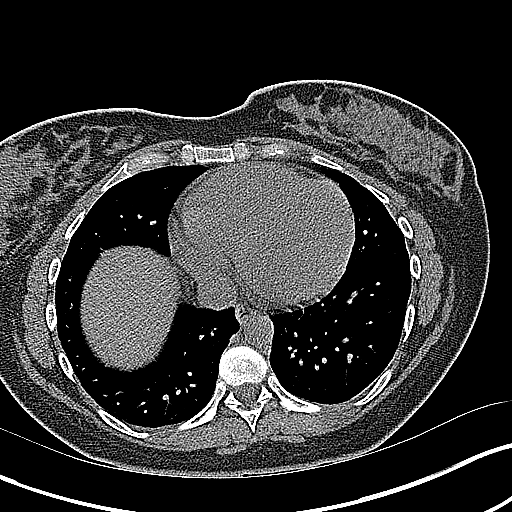
[im 37/40  soft-tissue]
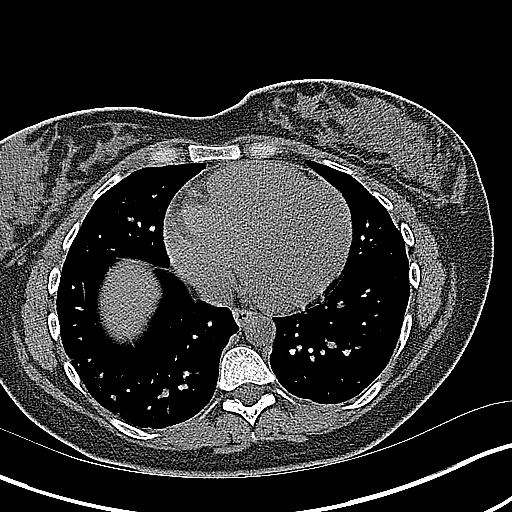

[Series 4: coronal · coronal · 0.58mm/px · 3 of 99 slices shown]
[im 33/99  soft-tissue]
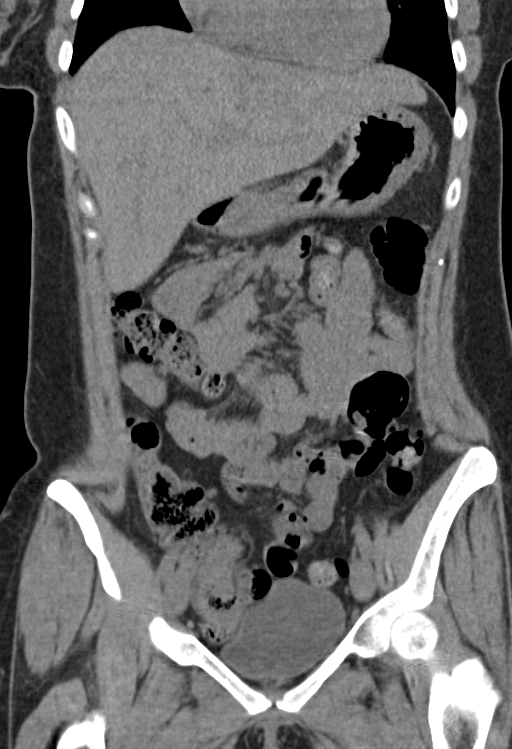
[im 44/99  soft-tissue]
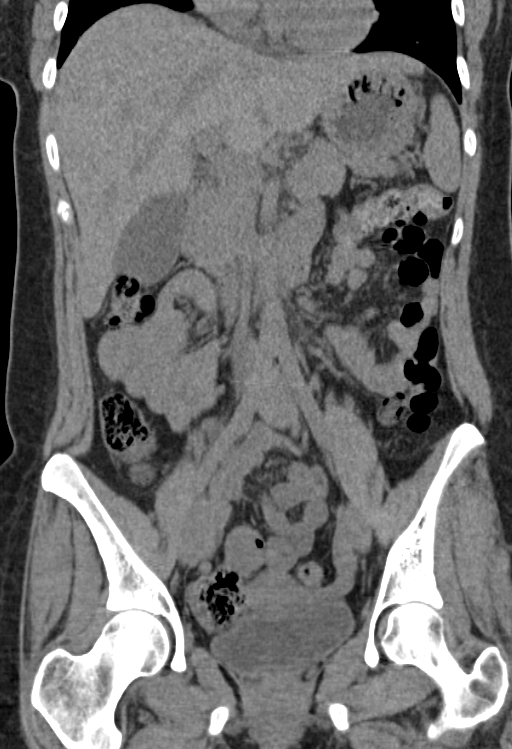
[im 55/99  soft-tissue]
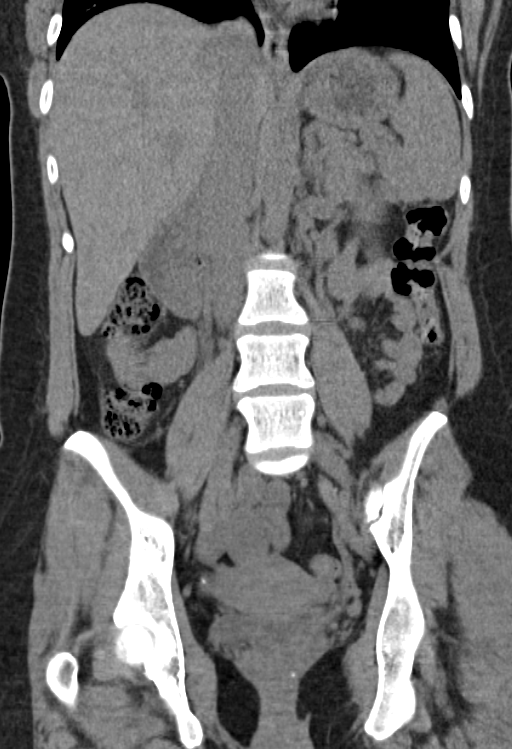

[15 of 46 positions shown; findings below may reference images not displayed]

FINDINGS: Lower chest:  Unremarkable

Hepatobiliary: 0.9 by 0.6 cm hypodense lesion anteriorly in the
right hepatic lobe on image [DATE], only vaguely seen and seemingly
potentially smaller on the prior CT from 02/10/2014. The more
peripheral and posterior hypodense lesion from 02/10/2014 is not
well seen on today's noncontrast exam. Gallbladder unremarkable.

Pancreas: Unremarkable

Spleen: Unremarkable

Adrenals/Urinary Tract: Adrenal glands normal. 2 mm right kidney
upper pole nonobstructive calculus. 1 mm right kidney lower pole
nonobstructive calculus. 1 mm left kidney upper pole nonobstructive
calculus.

There is a 3 mm calculus believed to be loose within the urinary
bladder eccentric to the right on image 74/2. The there new
calcifications in knee vicinity of the right UVJ compared to the
prior exam, and on image 72 of series 2 the more lateral of the 2
calcifications is directly in the vicinity of the distal ureter, but
does not have a tissue rim sign.

Stomach/Bowel: Unremarkable aside from appendectomy.

Vascular/Lymphatic: Unremarkable

Reproductive: Unremarkable

Other: No supplemental non-categorized findings.

Musculoskeletal: Mild sclerosis along the left sacroiliac joint.
IMPRESSION: 1. Mild right hydroureter noted, with a 3 mm calculus loose within
the urinary bladder likely from recent passage into the urinary
bladder. In addition, there is a very questionable 2 mm right distal
ureteral calculus near the UVJ. Although this is in the direct
vicinity of the distal ureter course, I do not see a tissue rim sign
which is normally present in the setting of a ureteral calculus and
accordingly a could represent a new vascular calcification. There
are several other new vascular calcifications in this area.
2. Additionally there are small nonobstructive bilateral renal
calculi.
3. 9 by 6 mm hypodense lesion anteriorly in the right hepatic lobe
on image [DATE], technically nonspecific due to small size, less
conspicuous and/or smaller on prior CT from 02/10/2014.
[DATE]. Mild left unilateral chronic appearing sacroiliitis.
